# Patient Record
Sex: Female | Born: 1965 | Race: White | Hispanic: No | Marital: Married
Health system: Midwestern US, Academic
[De-identification: ages and names within clinical notes are randomized; demographics above are authoritative.]

---

## 2014-06-15 IMAGING — CR ABDOMEN
1 series · 1 of 1 positions shown · non-contrast
Comparison: none

EXAM:  KUB
HISTORY: Pain.

[abdomen supine kub]
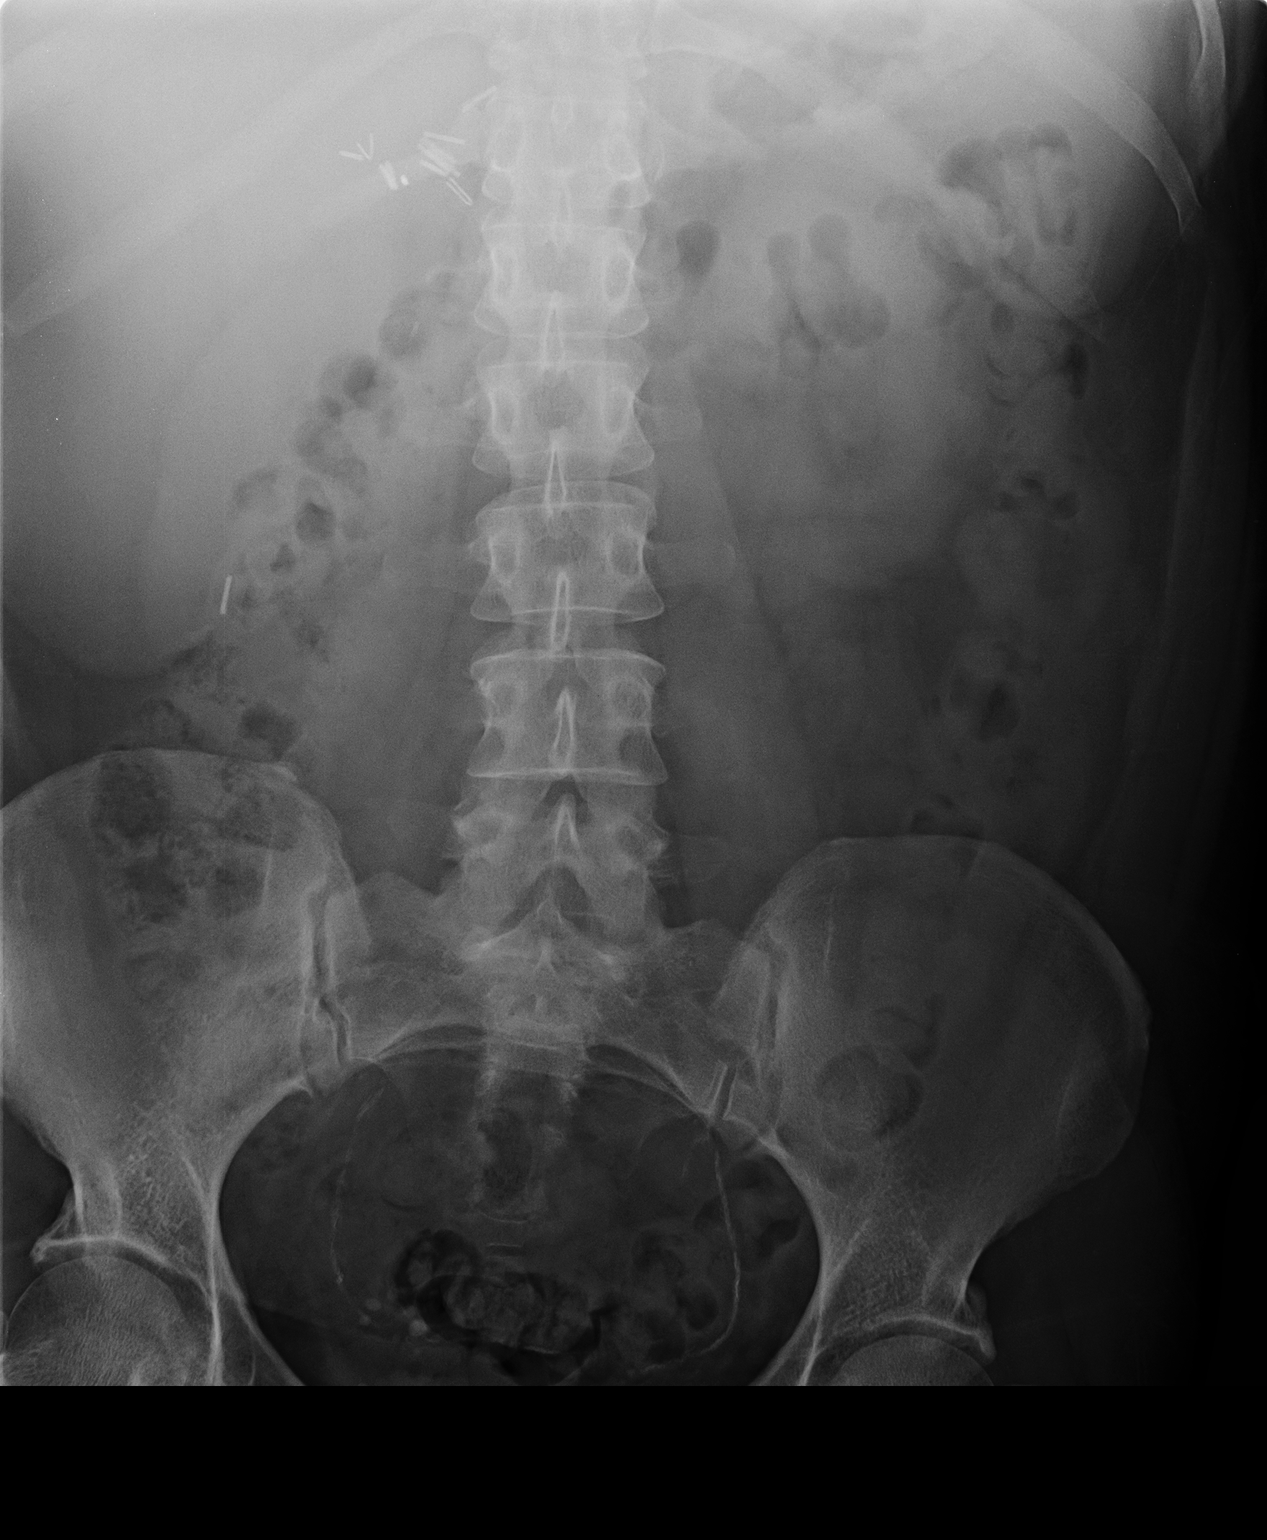

[1 of 1 positions shown; findings below may reference images not displayed]

IMPRESSION: 1. Segmental constipation.
FINDINGS: Segmental constipation but the bowel does not appear to be obstructed.  Clips
consistent with likely prior cholecystectomy.

Tech Notes: LOWER PELVIC PAIN. STRAUCH/TAVIS

## 2014-06-16 IMAGING — CT Abdomen^1_ABDOMEN_PELVIS_WITHOUT (Adult)
1 series · 1 of 1 positions shown, 3 images · non-contrast
Comparison: none

EXAM:  CT ABDOMEN AND PELVIS, NONCONTRAST
HISTORY: Right upper quadrant and right flank pain.

[Series 2: abd/pelvis w/o 5.0 soft tissue · axial · non-contrast · 0.88mm/px · z∈[-139,-139]mm · 1 of 1 slices shown, 3 images]
[im 1/1  soft-tissue]
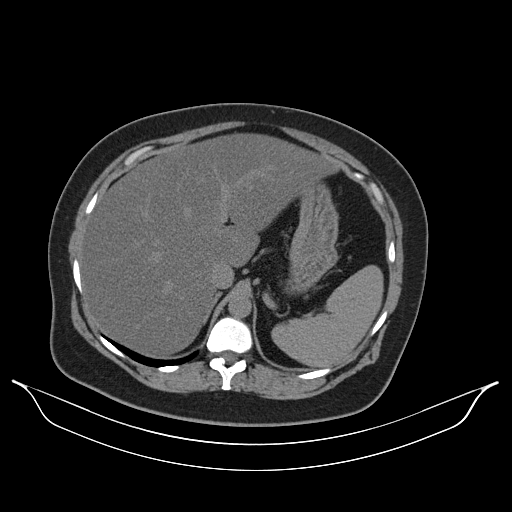
[im 1/1  lung]
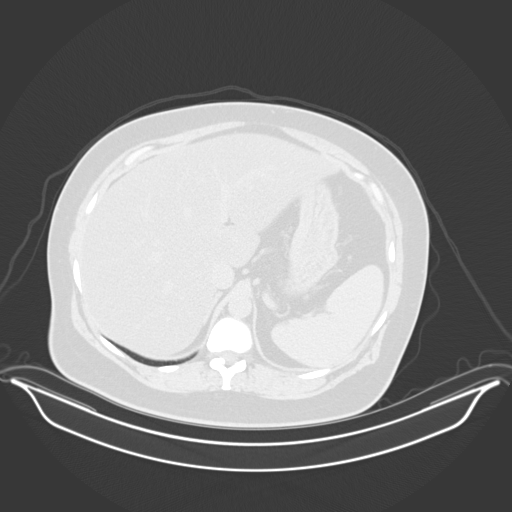
[im 1/1  bone]
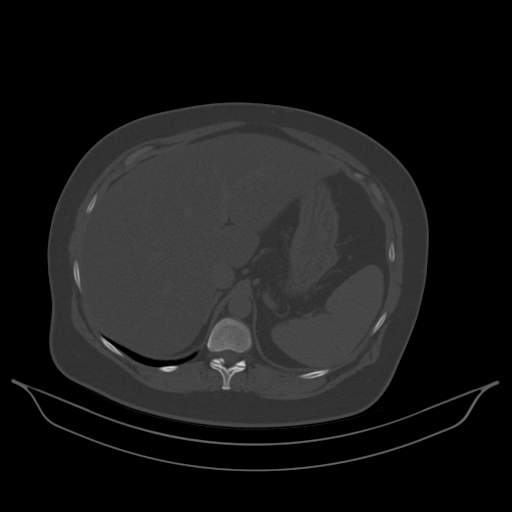

[1 of 1 positions shown; findings below may reference images not displayed]

IMPRESSION: 1.  Hepatic steatosis.
2.  Segmental constipation but not obstruction.
3.  No evident inflammatory process abdomen or pelvis.
Findings to [facility]go, [facility] provider.
FINDINGS: Abdomen:
Uniform low attenuation throughout the liver consistent with hepatic
steatosis.  No focal lesions evident within the liver.  Spleen normal.
Stomach normal.  Pancreas appears normal.
Prior cholecystectomy.
No evident intra or extrahepatic duct dilation.
Adrenal glands and kidneys appear normal.  No findings for obstructive or
inflammatory uropathy or nephrolithiasis.

Pelvis:
No evident diverticulitis or other inflammatory process.  I believe a prior
appendectomy.
Segmental constipation but not obstruction.
No free fluid in the pelvis.
No vesicoliths.

Tech Notes: PT C/O RIGHT SIDE ABD PAIN. HX OF PANCREATITIS AND CHOLE. METELLUS/FARZANA/ME

## 2014-09-26 IMAGING — CR CHEST
1 series · 1 of 1 positions shown · non-contrast
Comparison: none

PROCEDURE: CHEST
HISTORY: ACUTE DIZZINESS, NEAR-SYNCOPE

[chest port x-wise]
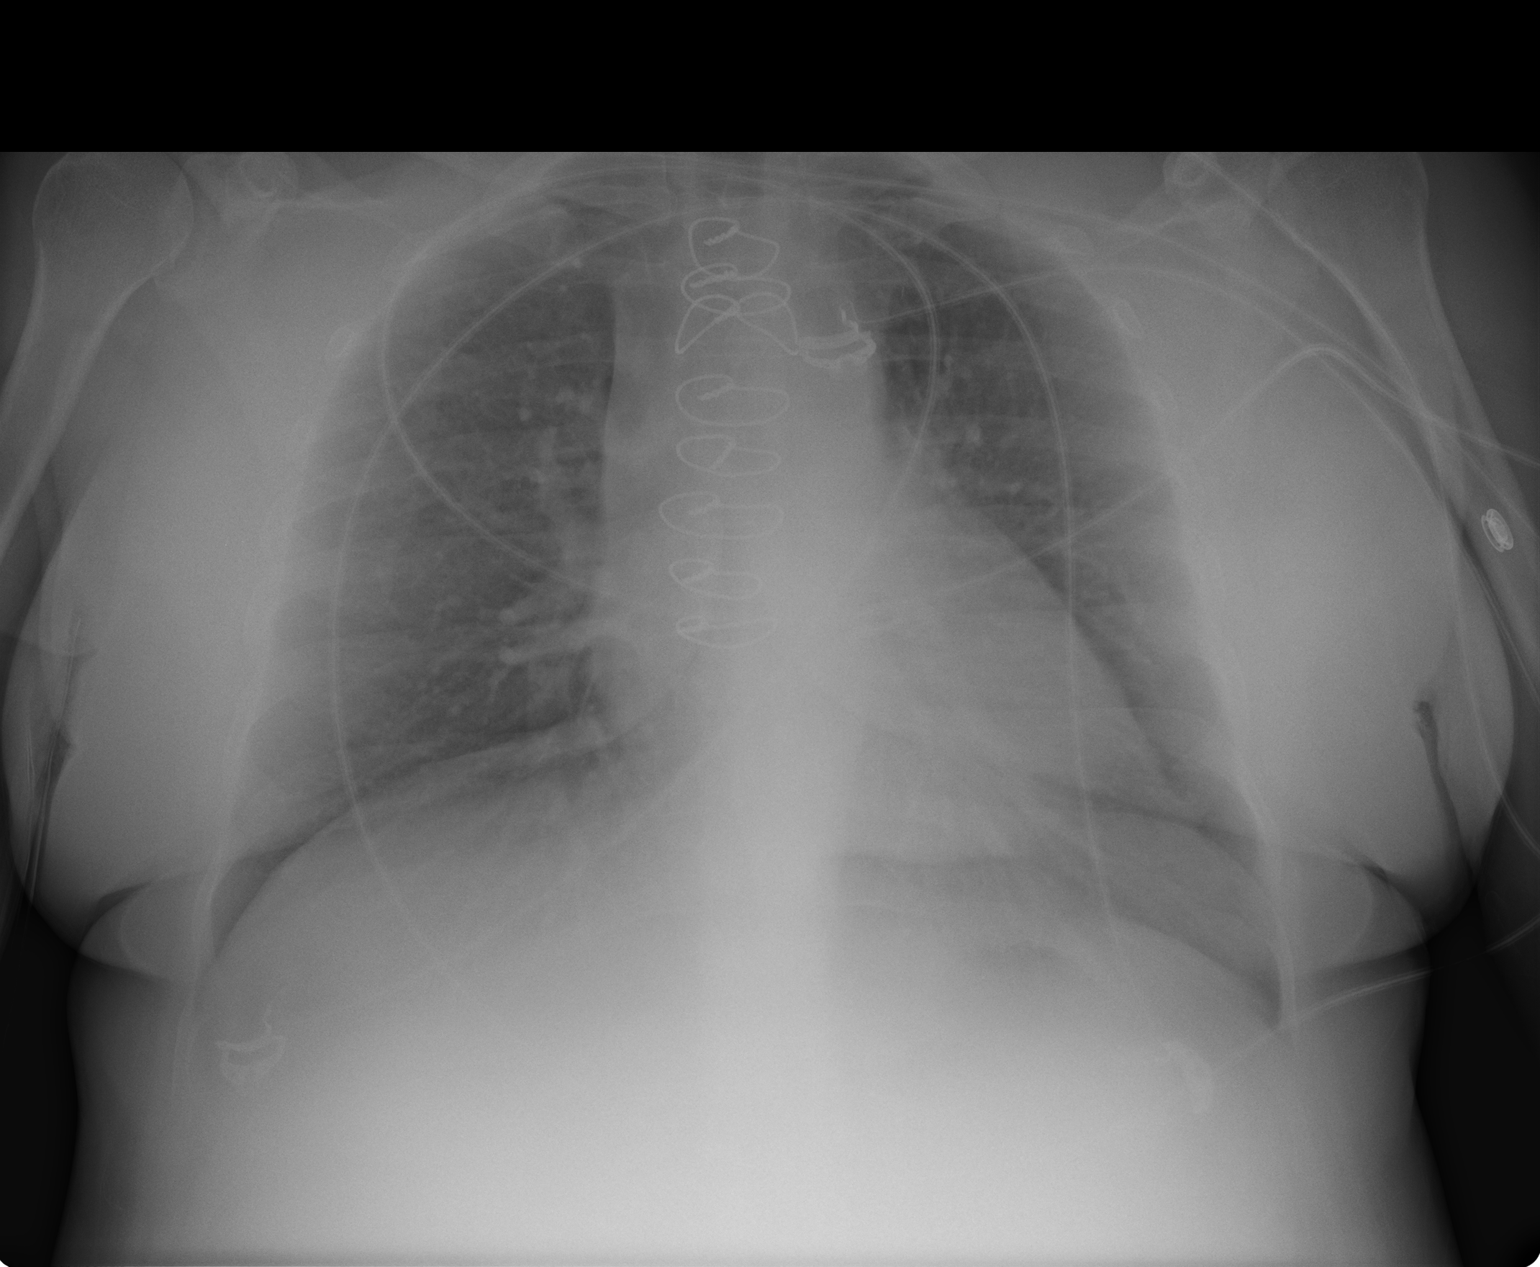

[1 of 1 positions shown; findings below may reference images not displayed]

FINDINGS: Single view chest without comparison shows the cardiac silhouette and mediastinal
structures are within normal limits. There are bilateral low lung volumes seen with vascular
crowding
and bibasilar atelectasis seen. No focal lung consolidation is seen. There is no pleural effusion
or
pneumothorax seen. Visualized osseous structures show no acute findings. Surgical changes are
seen of the mediastinum with sternotomy wires.
IMPRESSION: Low lung volumes are seen without radiographically evident acute cardiopulmonary process.

Tech Notes: ACUTE DIZZINESS, NEAR SYNCOPE -AK

## 2014-10-08 IMAGING — CR CHEST
2 series · 2 of 2 positions shown · non-contrast
Comparison: 26 September, 2014

EXAM: CHEST
REASON FOR EXAM: Vertebral
TECHNIQUE: PA   Lateral

[chest port x-wise]
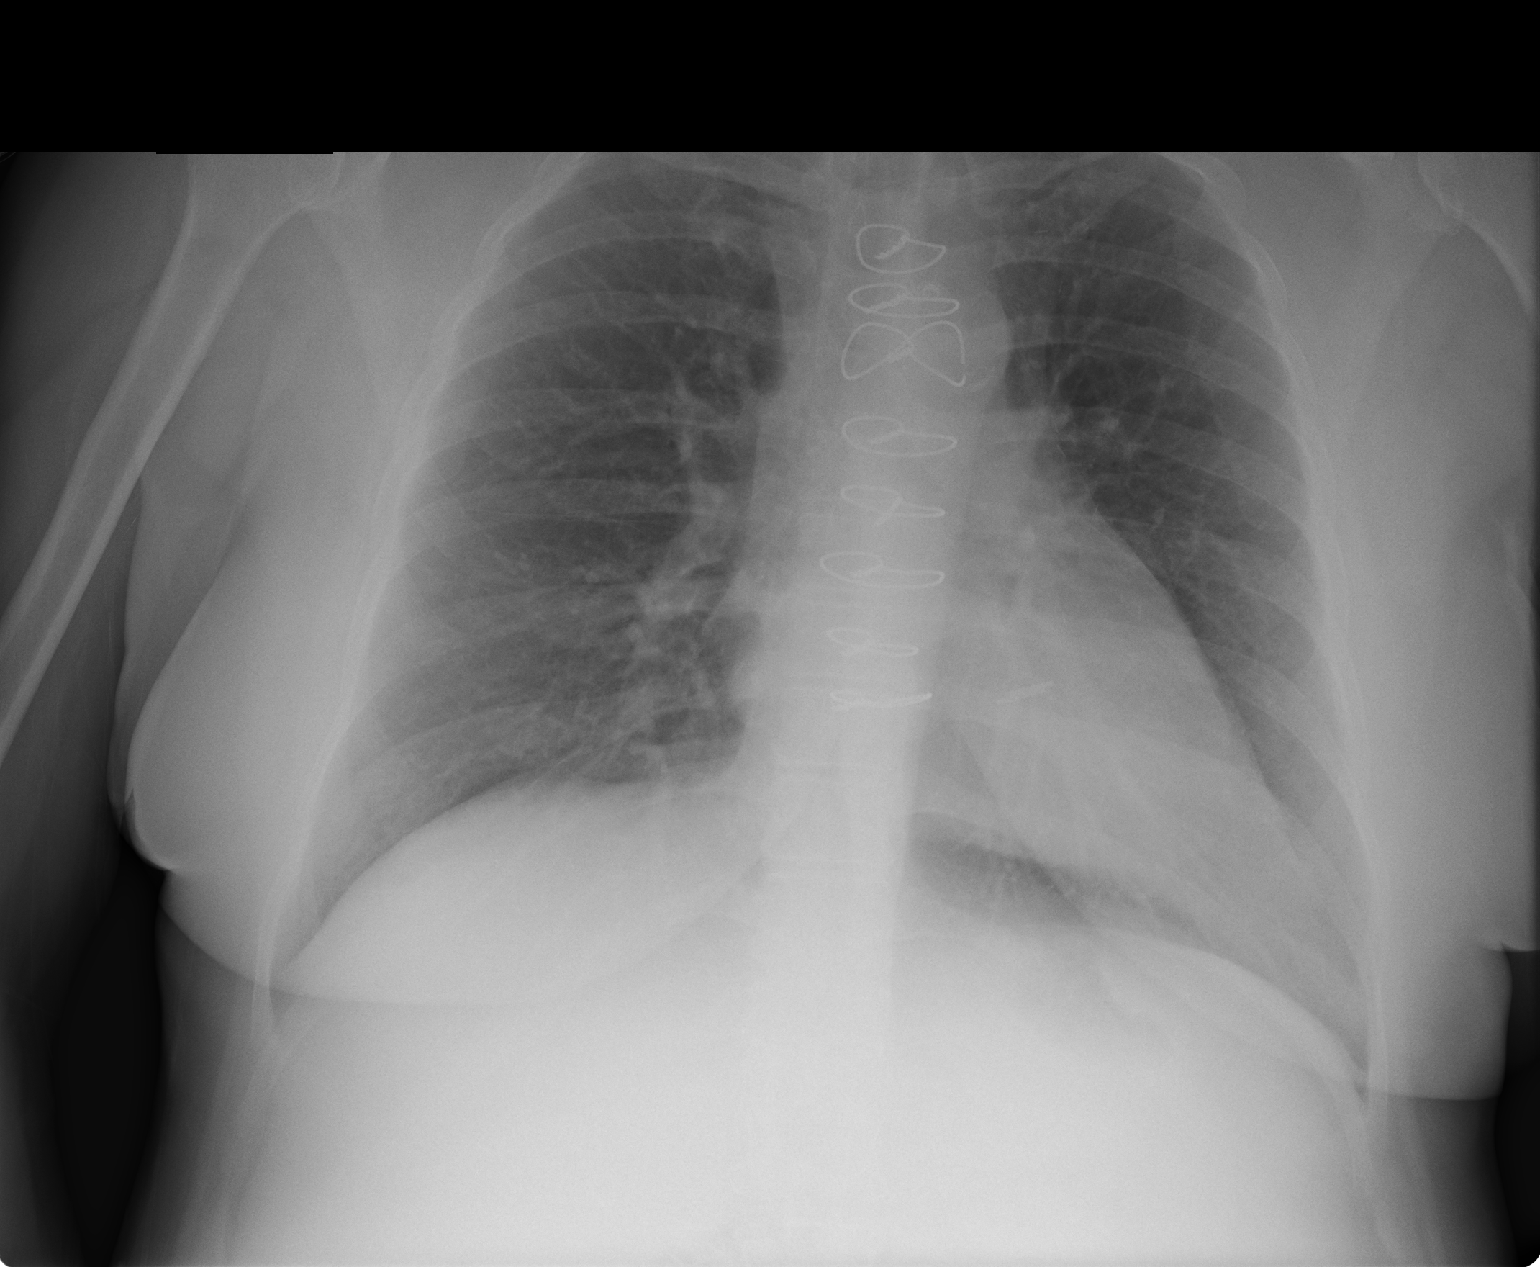

[chest lat]
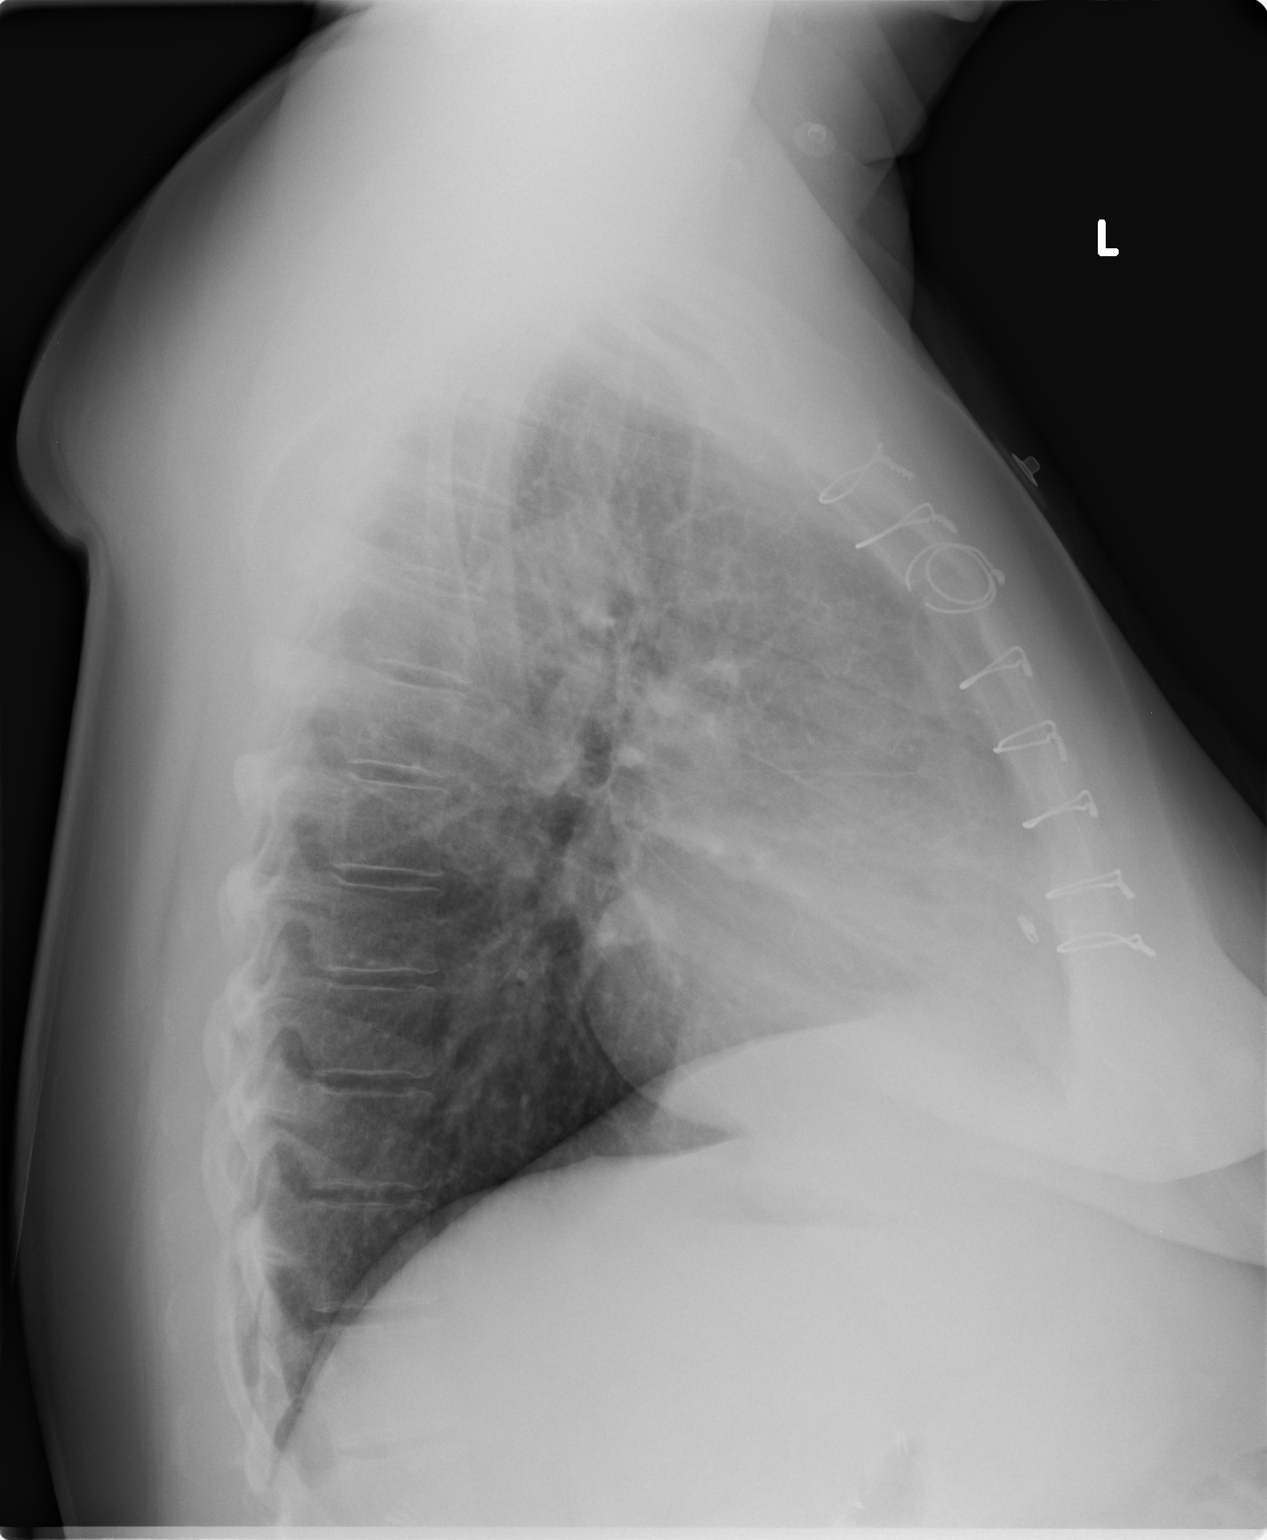

[2 of 2 positions shown; findings below may reference images not displayed]

IMPRESSION: No acute cardiopulmonary process.
Prior sternotomy.
FINDINGS: Lungs are well aerated and clear.  No focal consolidation, pleural effusion
or pneumothorax.  Cardiomediastinal silhouette is unremarkable and stable
when compared to prior study.  There has been prior sternotomy.

Dictated by Adeoye, Cathi
Preliminary report until reviewed and verified by Fitoz, T.C. Mehmet

Tech Notes: VERTIGO X 1 DAY, HX: HAD PREVIOUS EPISODE 2 WEEKS AGO, HTN, CAD, CLOG IN CAROTID ARTERY
JF

## 2014-10-08 IMAGING — CT Head^_WITHOUT_CONTRAST (Adult)
1 of 2 series · 15 of 30 positions shown, 19 images · non-contrast
Comparison: none

PROCEDURE: CT HEAD WITHOUT
HISTORY: ACUTE VERTIGO X 1 DAY, HX: HAD A PREVIOUS SIMILAR EPISODE 2 WEEKS
AGO, H/O CAD, HTN, PT STATES SHE HAS A CLOGGED CAROTID ARTERY

No prior comparison.
TECHNIQUE: Multiple contiguous axial images of the head without contrast.

[Series 2: brain w/o 4.8 brain · axial · non-contrast · 0.46mm/px · z∈[+65,+190]mm · 15 of 28 slices shown, 19 images]
[im 2/28  brain]
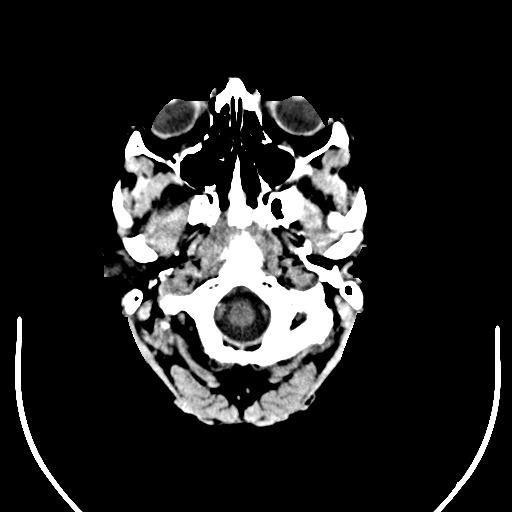
[im 2/28  bone]
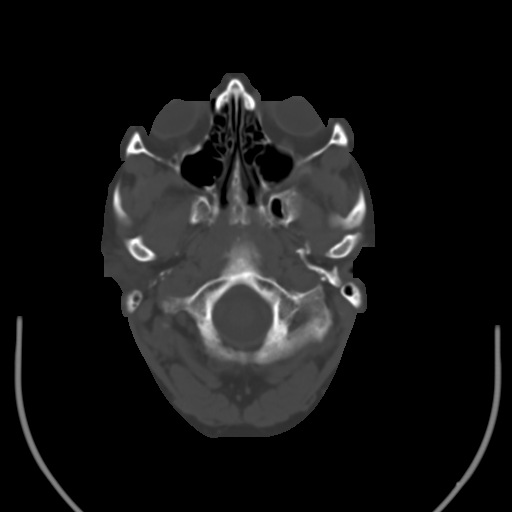
[im 4/28  brain]
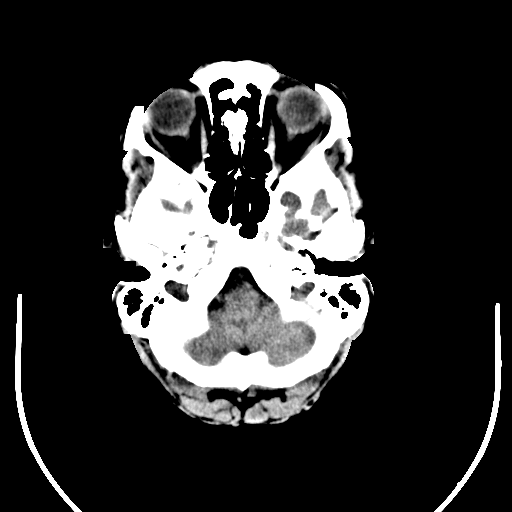
[im 5/28  brain]
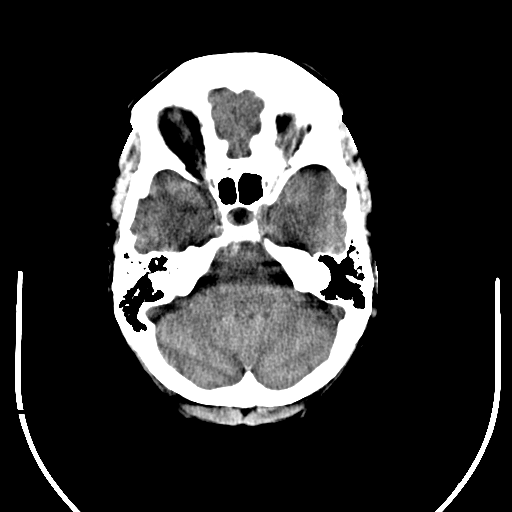
[im 7/28  brain]
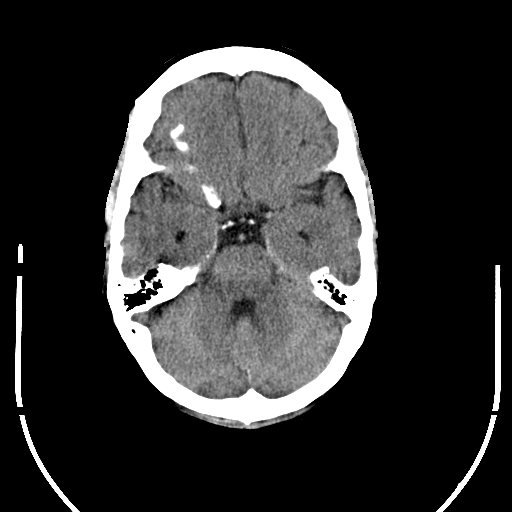
[im 9/28  brain]
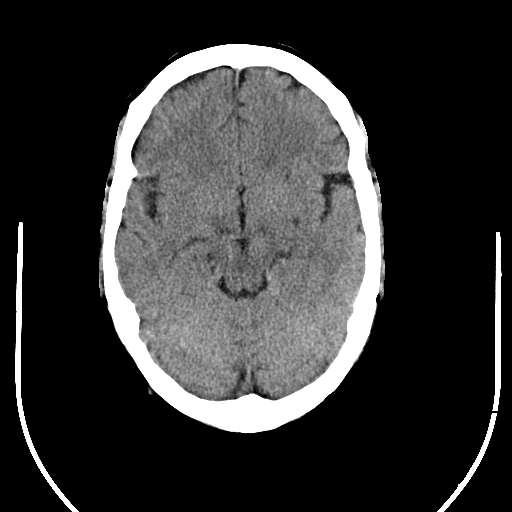
[im 9/28  bone]
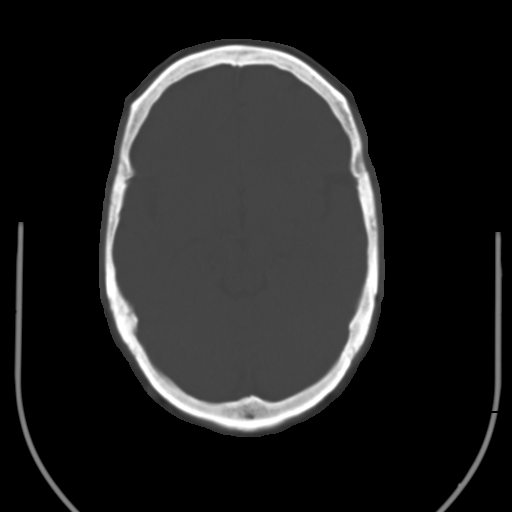
[im 10/28  brain]
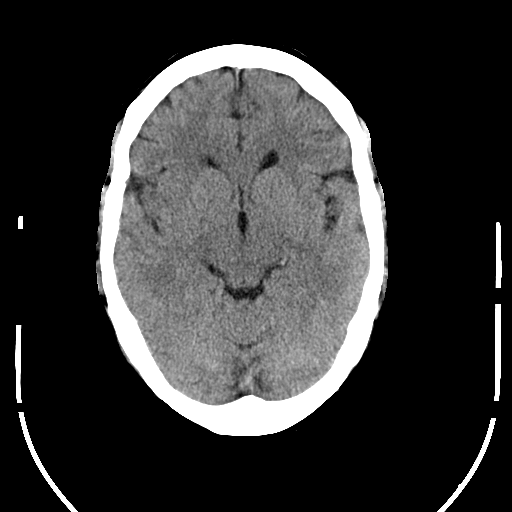
[im 13/28  brain]
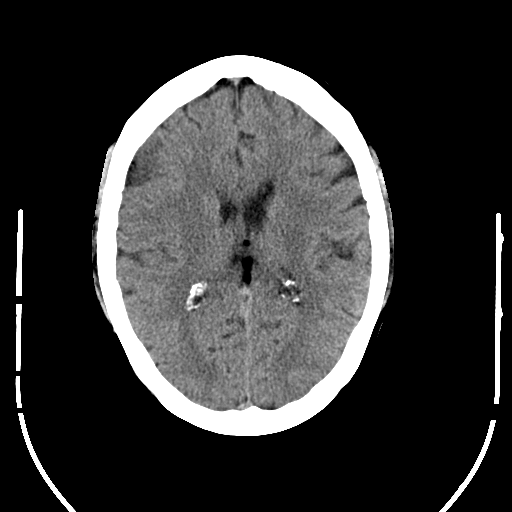
[im 14/28  brain]
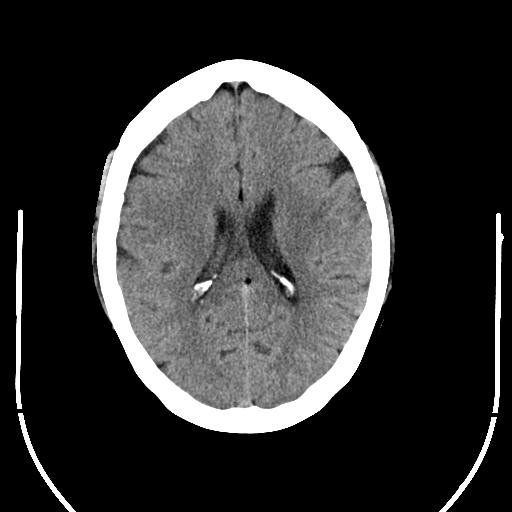
[im 15/28  brain]
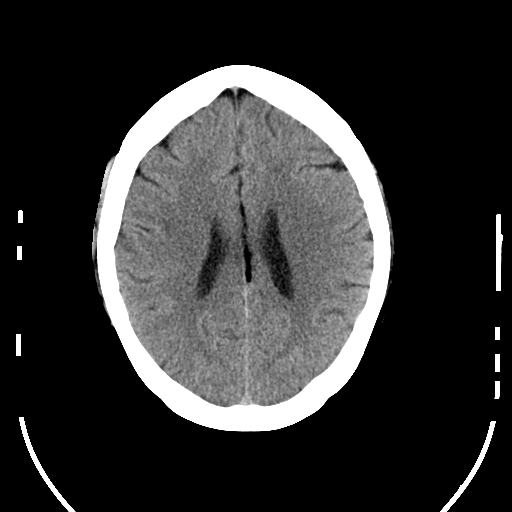
[im 15/28  bone]
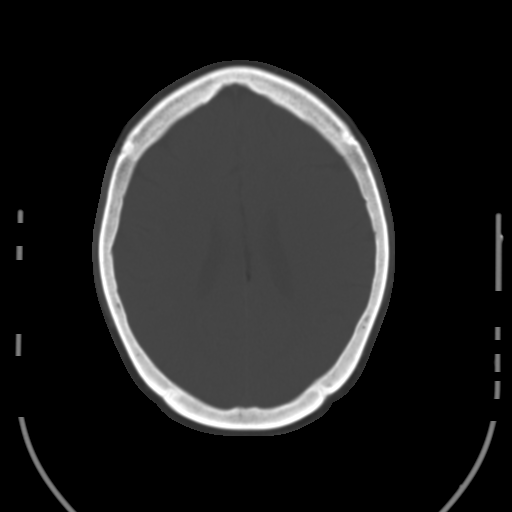
[im 18/28  brain]
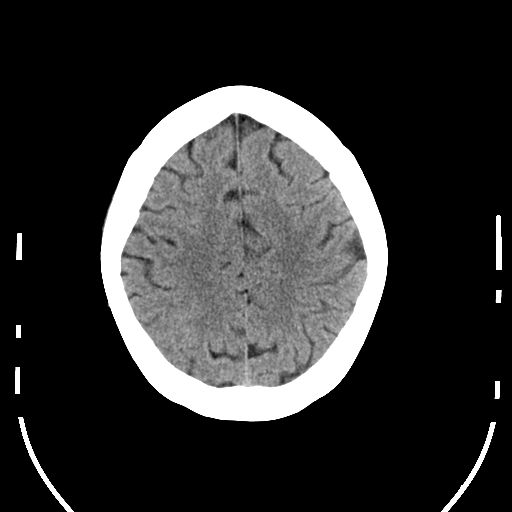
[im 19/28  brain]
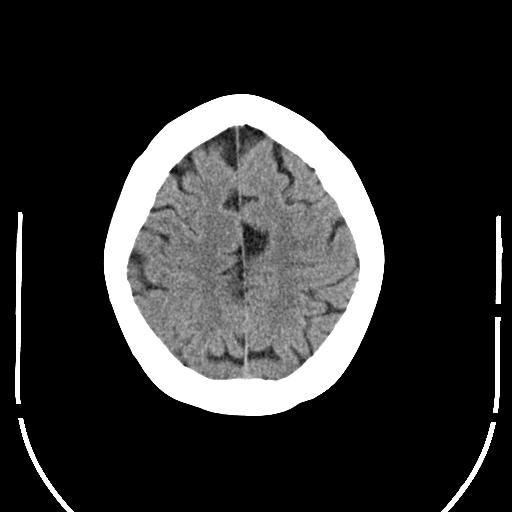
[im 21/28  brain]
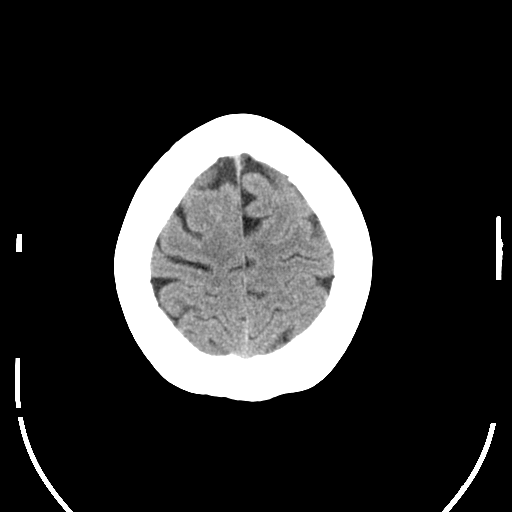
[im 23/28  brain]
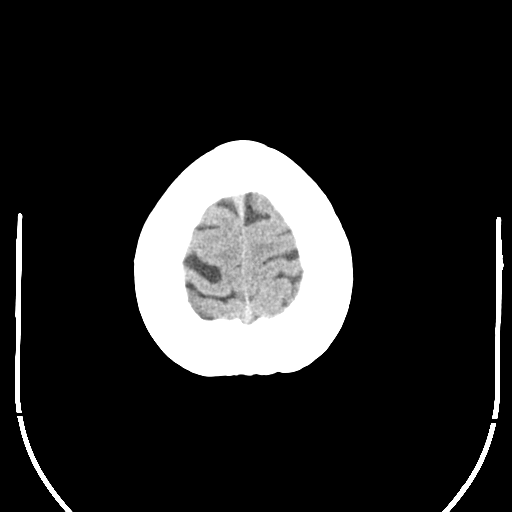
[im 23/28  bone]
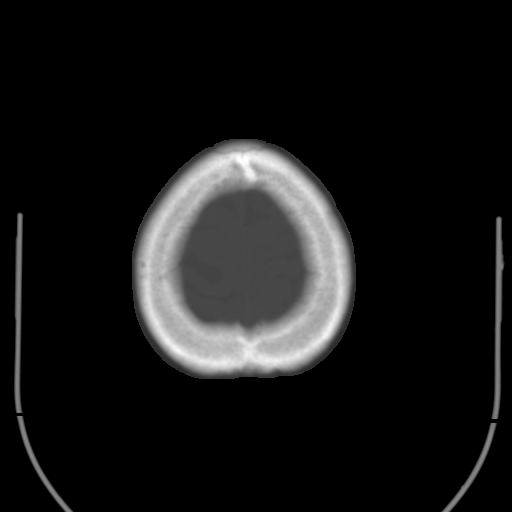
[im 24/28  brain]
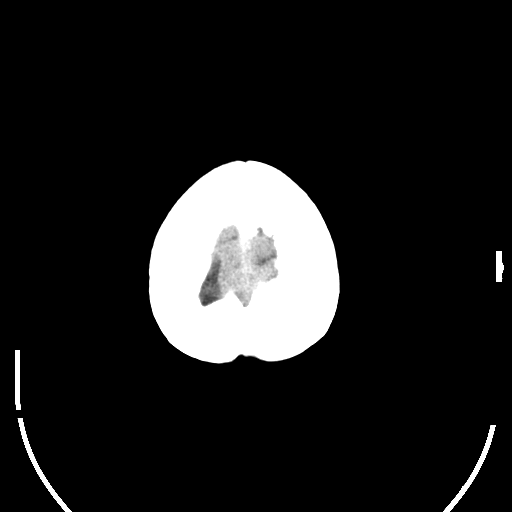
[im 26/28  brain]
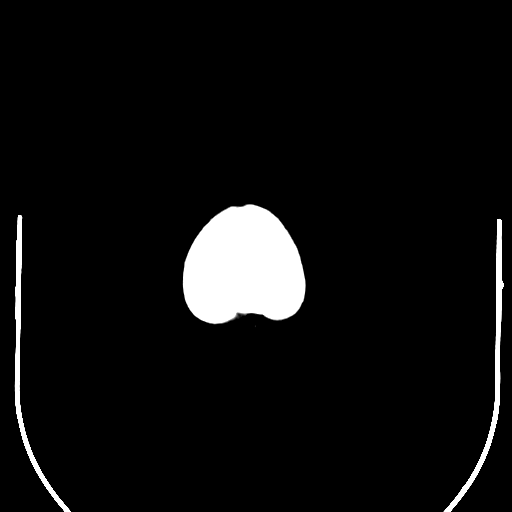

[15 of 30 positions shown; findings below may reference images not displayed]

FINDINGS: There is no acute intracranial hemorrhage. No abnormal extra-axial fluid collection is
seen.
There no findings of cerebral edema. There is no intracranial mass effect or shift of midline
structures.
There is no sulcal effacement. The gray-white differentiation is preserved throughout. The basilar
cisterns are open. No focal encephalomalacia is seen. The calvarium is intact. The visualized
sinuses
and mastoid air cells are clear. The frontal sinuses are hypoplastic.
IMPRESSION: No acute intracranial pathology identified by CT.

INTERPRETING DOCTOR: CUBAS, KENNETH M.D.

Tech Notes: VERTIGO X 1 DAY, HX: HAD PREVIOUS EPISODE 2 WEEKS AGO, HTN, CAD, CLOG IN CAROTID ARTERY
JF
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
## 2017-09-25 IMAGING — CR CHEST
1 series · 1 of 1 positions shown · non-contrast
Comparison: none

EXAM

Chest 1 view.
INDICATION
Shortness of breath.

[chest port x-wise]
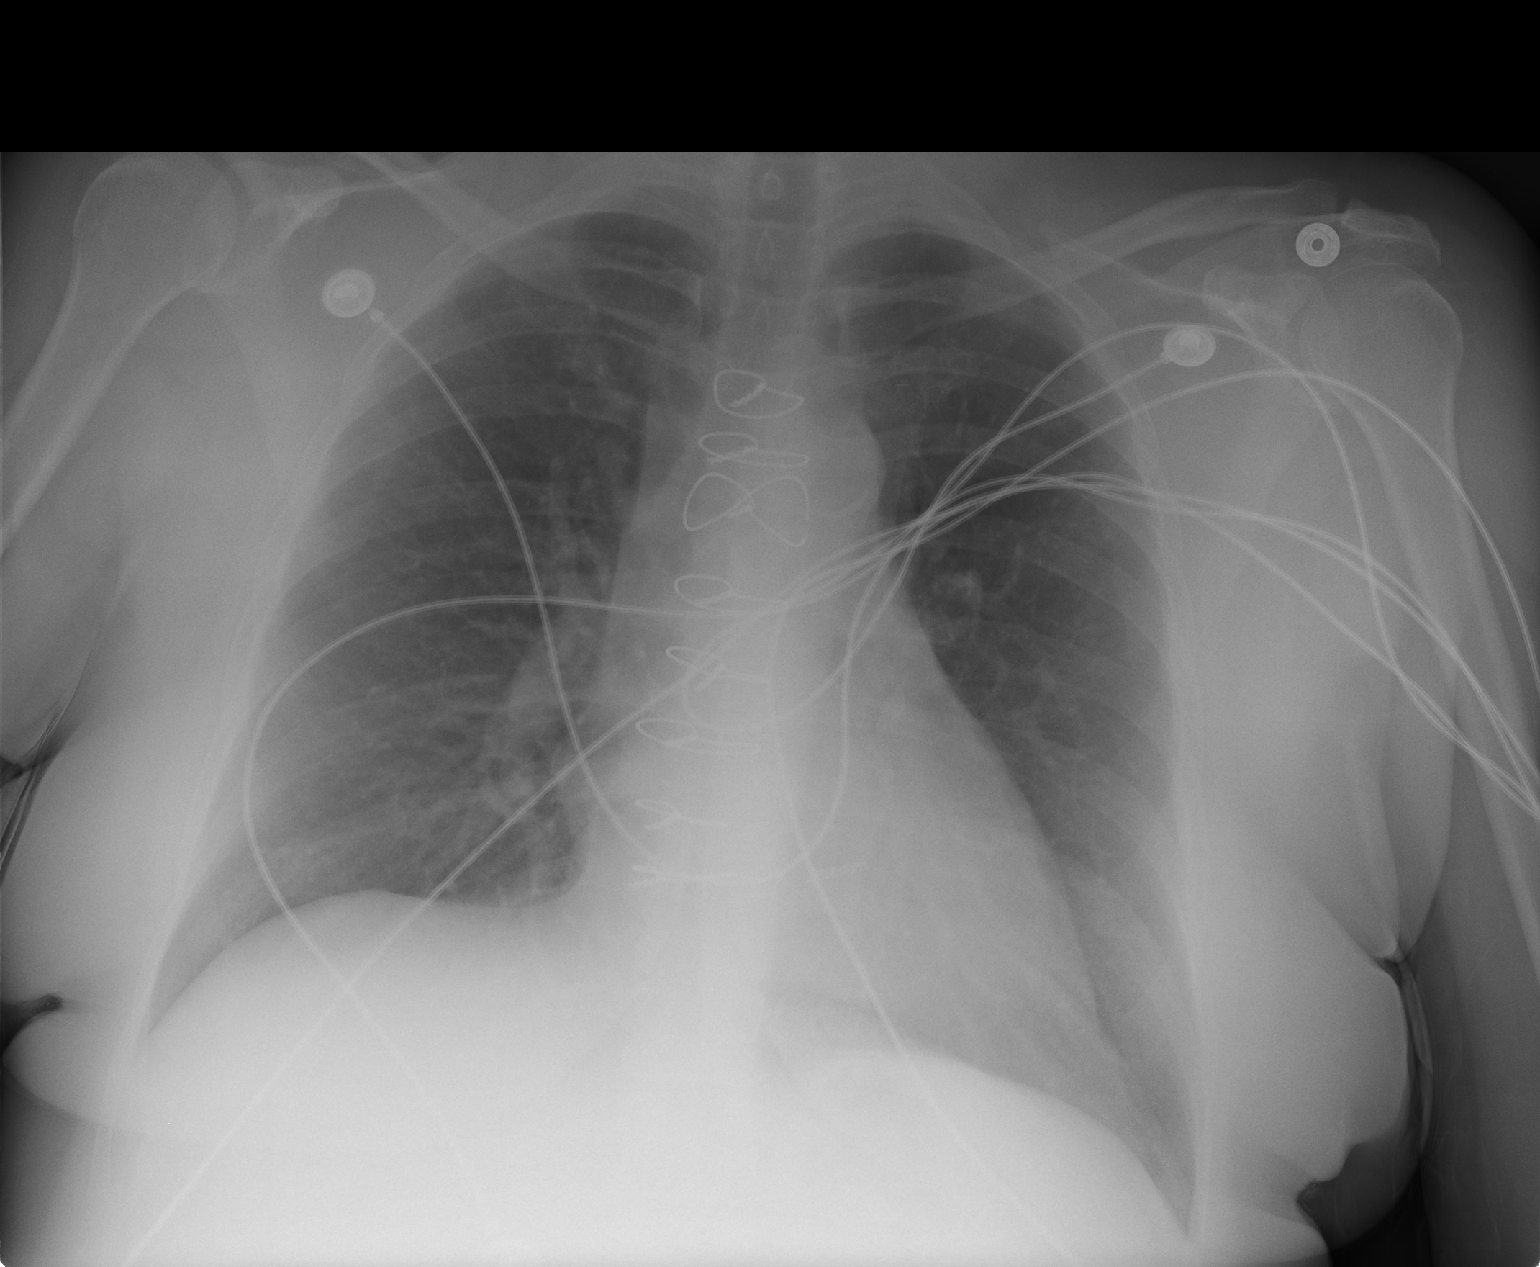

[1 of 1 positions shown; findings below may reference images not displayed]

FINDINGS: There are no comparisons.

The heart is normal in size. There has been a median sternotomy.

There is no failure, effusion, or consolidation.

IMPRESSION

There is no acute lung process.

Tech Notes:

SOA AND HYPERGLYCEMIA X TODAY. H/O CAD WITH BYPASS IN 5144. ME

## 2018-02-15 IMAGING — CT ABDOMEN_PELVIS W(Adult)
2 of 3 series · 13 of 46 positions shown, 15 images · IV contrast (agent unspecified)
Comparison: none

PROCEDURE: ABDOMEN_PELVIS W(Adult)
HISTORY: Pt c/o epigastric pain and increased amylase. Hx of pancreatitis and diabetes. AW
TECHNIQUE: Axial CT imaging of the abdomen and pelvis was performed with IV contrast. This exam was
performed using one or more the following dose reduction techniques: Automated exposure control,
adjustment of the mA and/or KV according to the patient's size or use of iterative reconstruction
technique.
Total DLP dose measures 296 mGy with a total CTDI dose measuring 6 mGy.

[Series 2: abdomen ax 3.00 br40 s3 · axial · 0.57mm/px · z∈[+1197,+1589]mm · 10 of 151 slices shown, 12 images]
[im 10/151  soft-tissue]
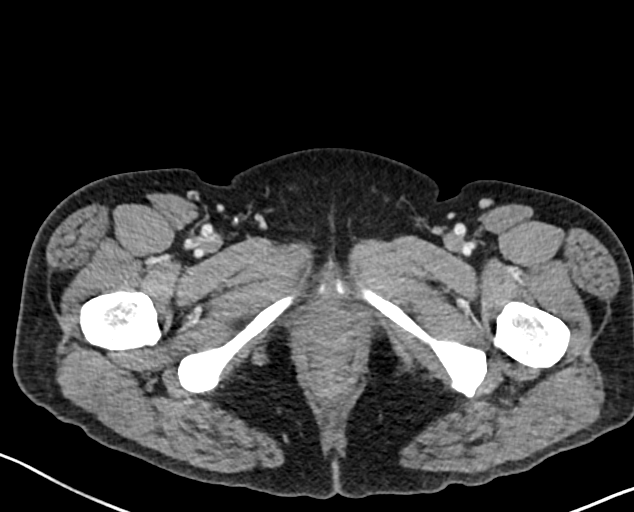
[im 10/151  bone]
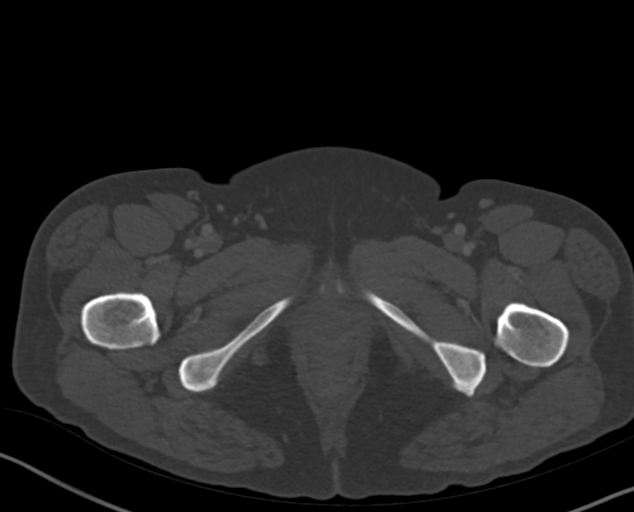
[im 25/151  soft-tissue]
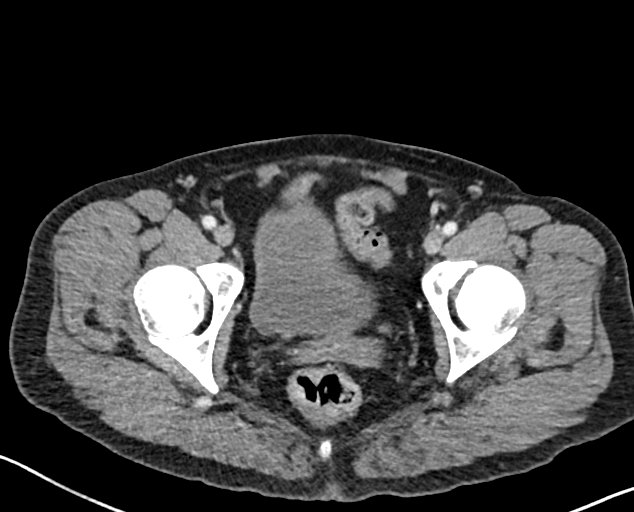
[im 39/151  soft-tissue]
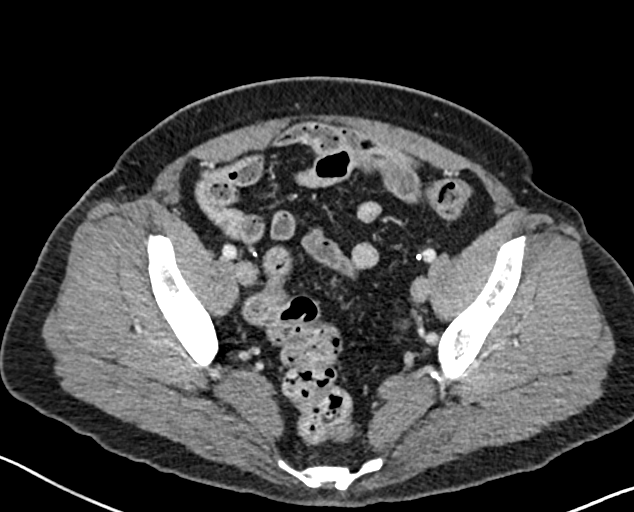
[im 54/151  soft-tissue]
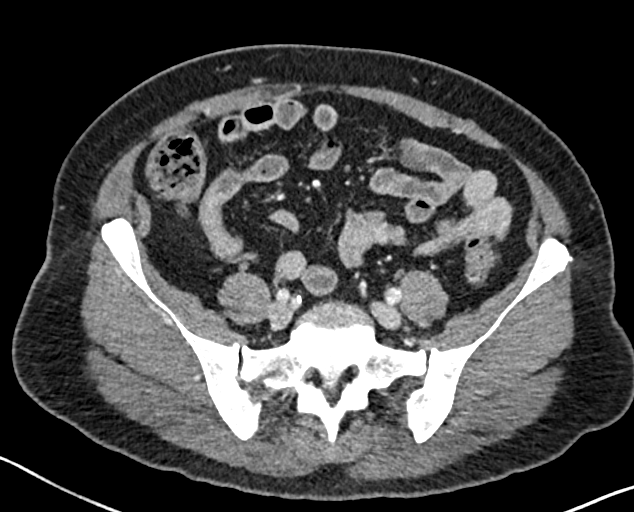
[im 68/151  soft-tissue]
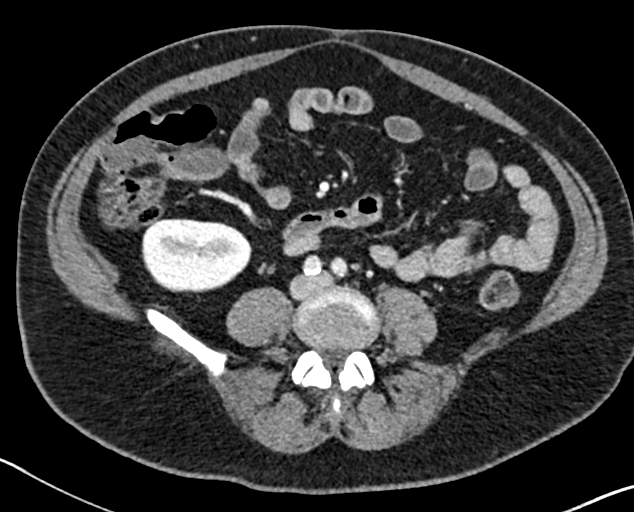
[im 83/151  soft-tissue]
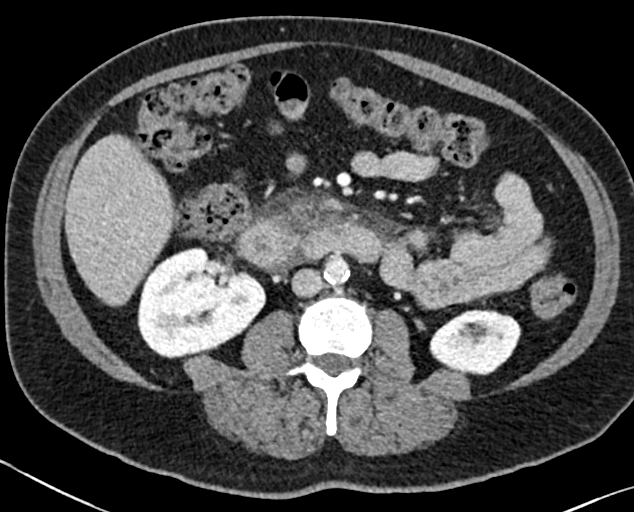
[im 97/151  soft-tissue]
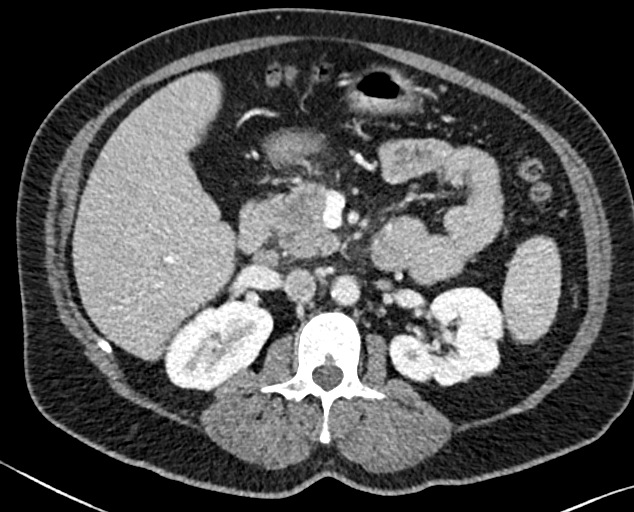
[im 112/151  soft-tissue]
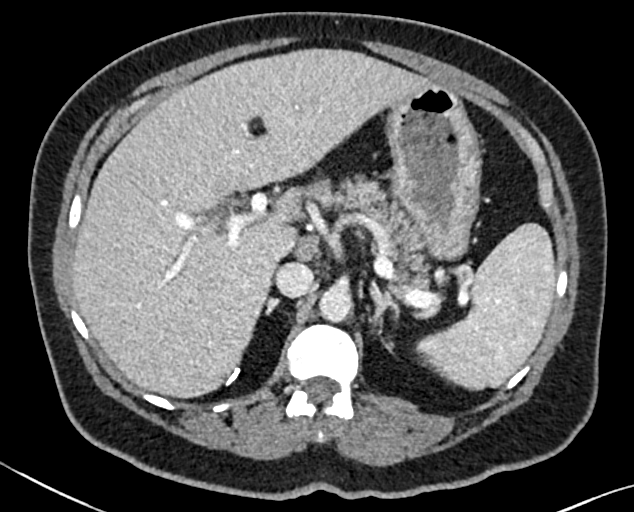
[im 126/151  soft-tissue]
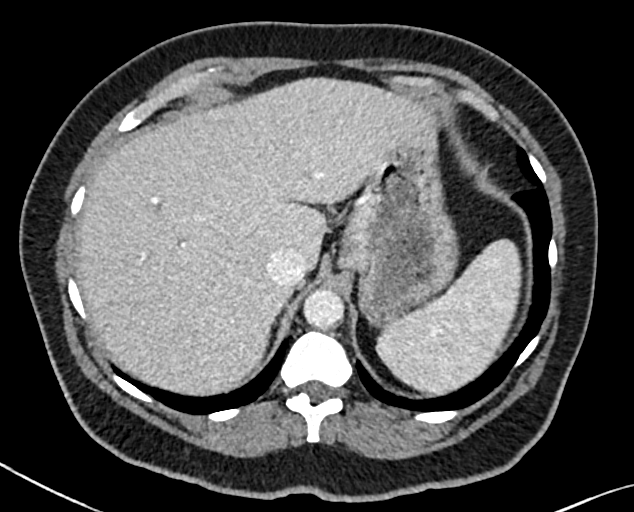
[im 126/151  bone]
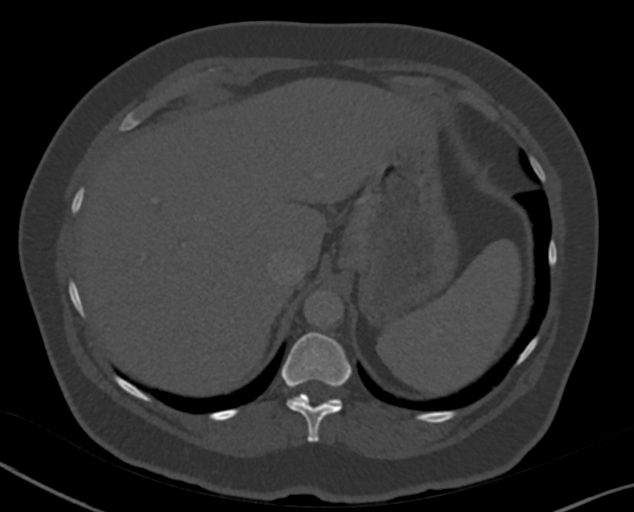
[im 141/151  soft-tissue]
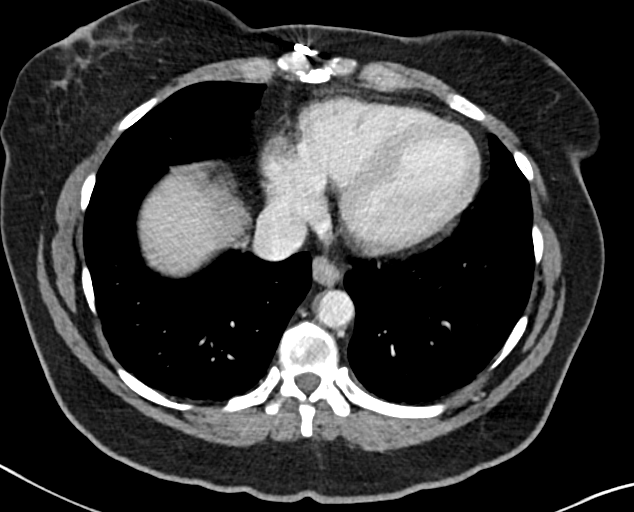

[Series 4: abdomen cor 3.00 br40 s3 · coronal · 0.70mm/px · 3 of 97 slices shown]
[im 33/97  soft-tissue]
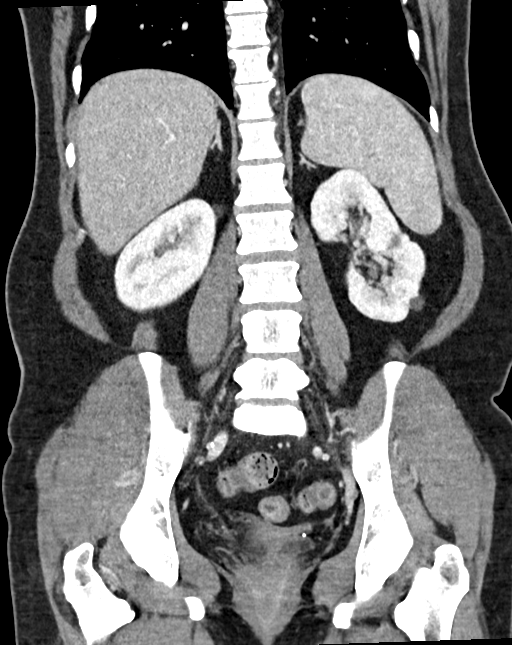
[im 43/97  soft-tissue]
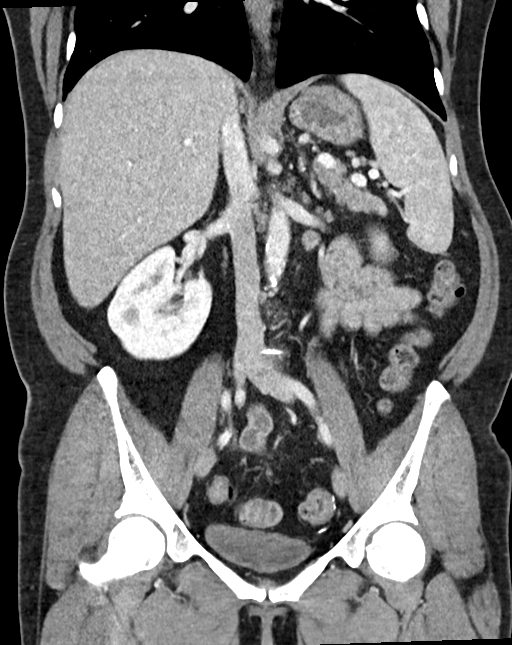
[im 54/97  soft-tissue]
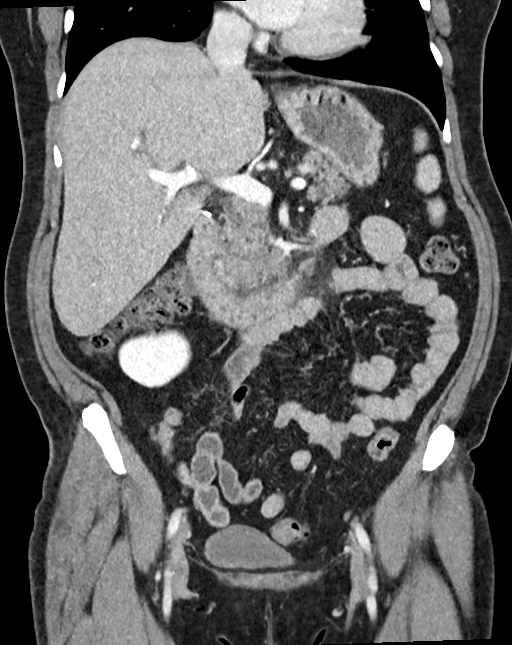

[13 of 46 positions shown; findings below may reference images not displayed]

FINDINGS: The lung bases are clear. There is enlargement with mild stranding of the head of the pancreas
diffusely.
No focal pancreatic lesion or ductal dilation is seen. Surgical changes are seen reflecting
cholecystectomy.
The liver, spleen, and adrenals are within normal limits. There is no intrahepatic ductal
dilatation seen. No
mesenteric or retroperitoneal adenopathy is seen. The right kidney shows no hydronephrosis or
perinephric
fat stranding. The left kidney shows no hydronephrosis or perinephric fat stranding. The aorta is
normal in
course and caliber. No pathologically dilated loops of large or small bowel are seen. Normal
appearing
appendix is not definitively seen in the right lower quadrant. Moderate amount of stool seen
throughout the
colon. There is no free air or free fluid seen in the abdomen. The contrasted urinary bladder is
unremarkable.
IMPRESSION: 1. There is no hydronephrosis or perinephric fat stranding seen.
2. Nonobstructive bowel gas pattern without free air.
3. Mild pancreatitis predominately involving the head of the pancreas with enlargement and fat
stranding.
No abscess or pseudocyst is seen.

Tech Notes:

Pt hx of pancreatitis and diabetes. Advised to withold metformin for 48 hours. GFR 81. 100mL [id number]
administered. AW

## 2018-04-04 IMAGING — NM ONE DAY MPI
4 series · 14 of 14 positions shown · non-contrast
Comparison: none

[(person_name) · 4.52mm/px · 1 of 1 slices shown (1 of 2)]
[im 1/1]
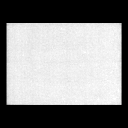

[(person_name) · 4.52mm/px · 1 of 1 slices shown (2 of 2)]
[im 1/1]
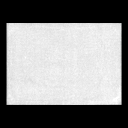

[rest · 6.78mm/px · 6 of 72 frames shown]
[frame 7/72]
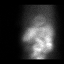
[frame 19/72]
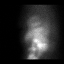
[frame 31/72]
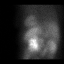
[frame 43/72]
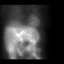
[frame 55/72]
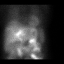
[frame 67/72]
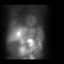

[sgate · 6.78mm/px · 6 of 576 frames shown]
[frame 49/576]
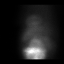
[frame 145/576]
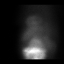
[frame 241/576]
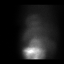
[frame 337/576]
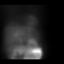
[frame 433/576]
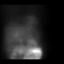
[frame 529/576]
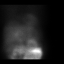

[14 of 14 positions shown; findings below may reference images not displayed]

FINAL REPORT IS SCANNED IN THE PATIENT'S EMR.

Tech Notes:

## 2018-07-31 IMAGING — CR LOW_EXM
3 series · 3 of 3 positions shown · non-contrast
Comparison: none

[foot]
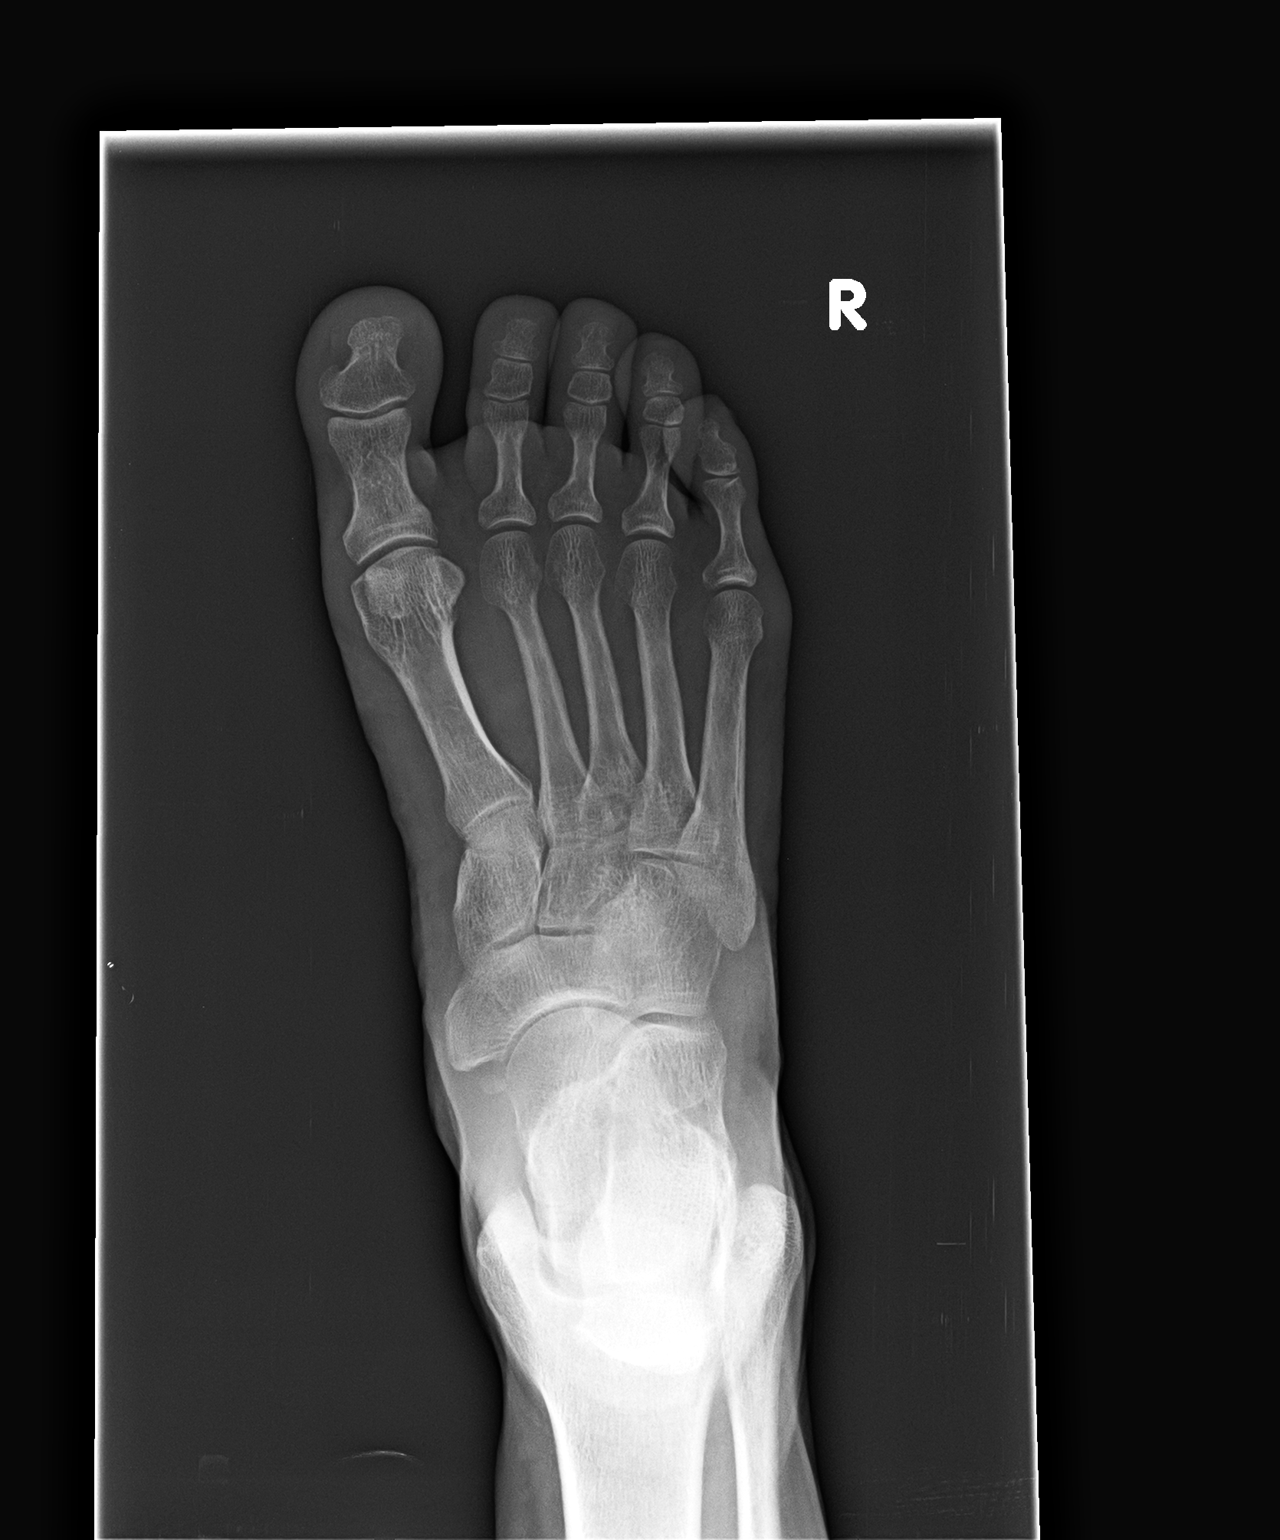

[foot obl]
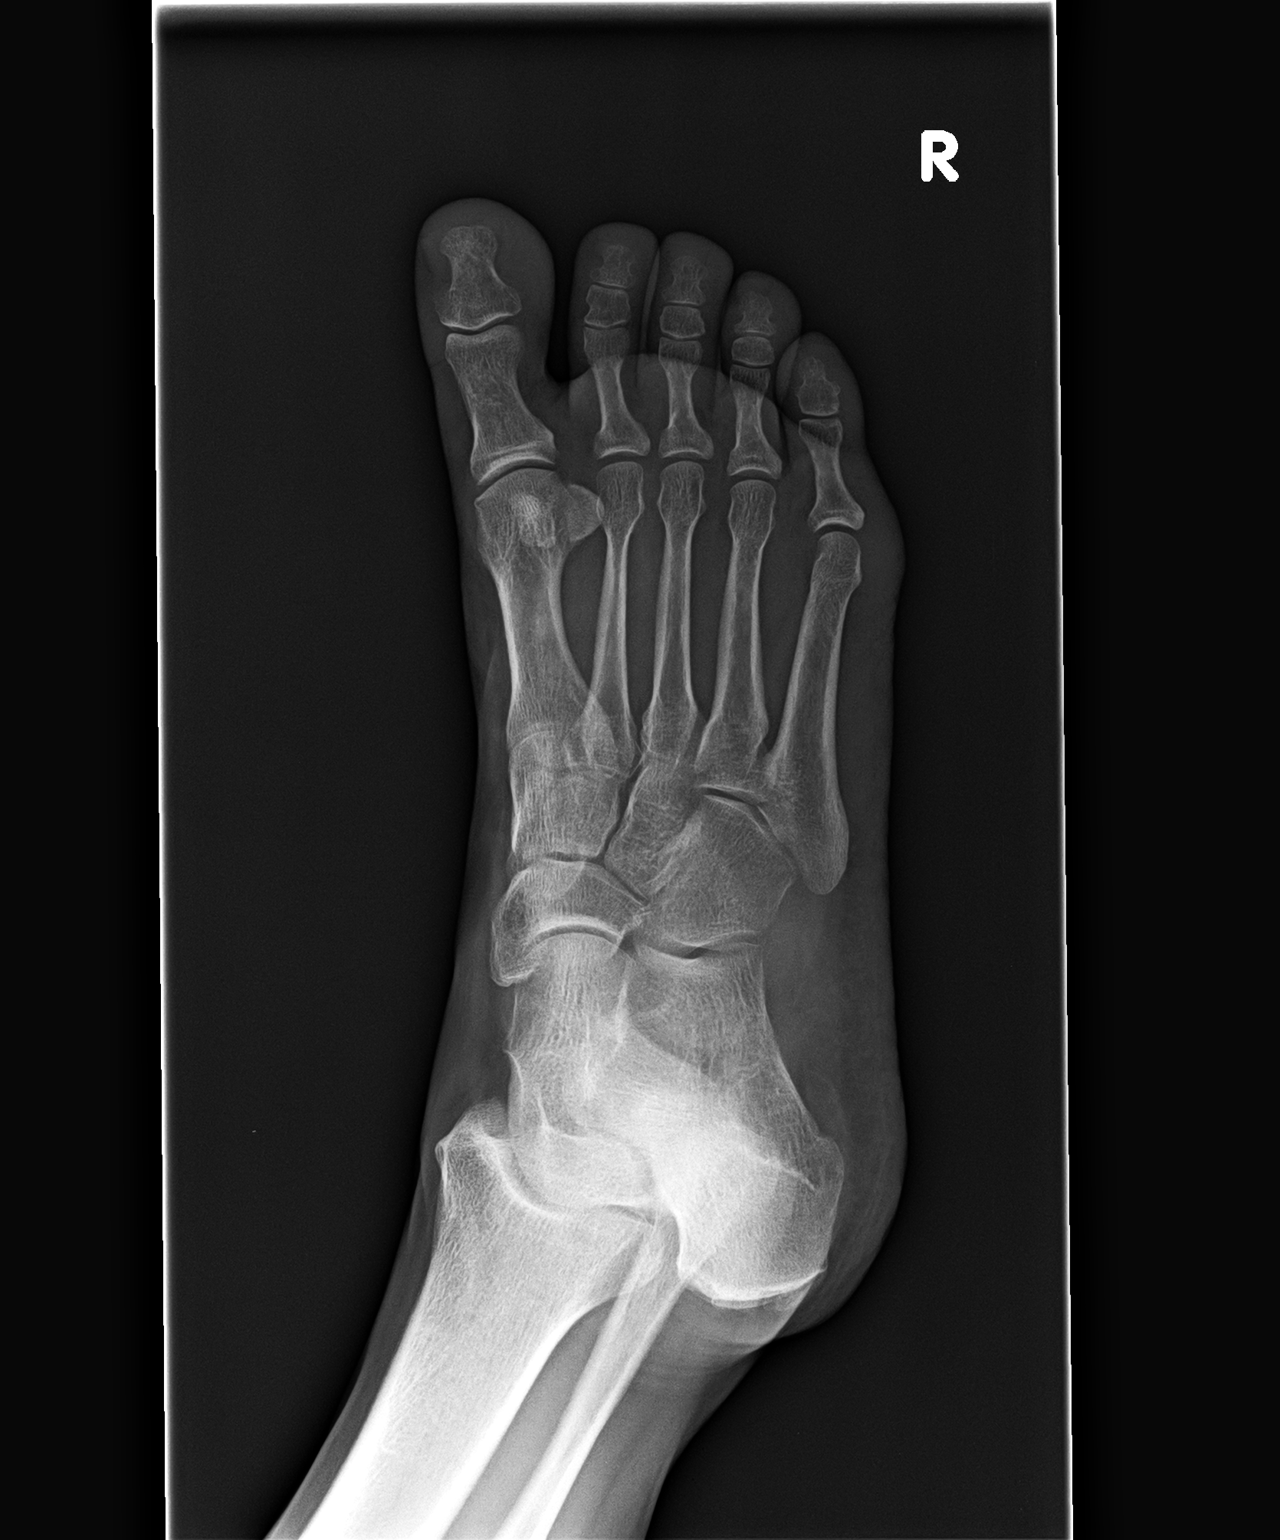

[foot lat]
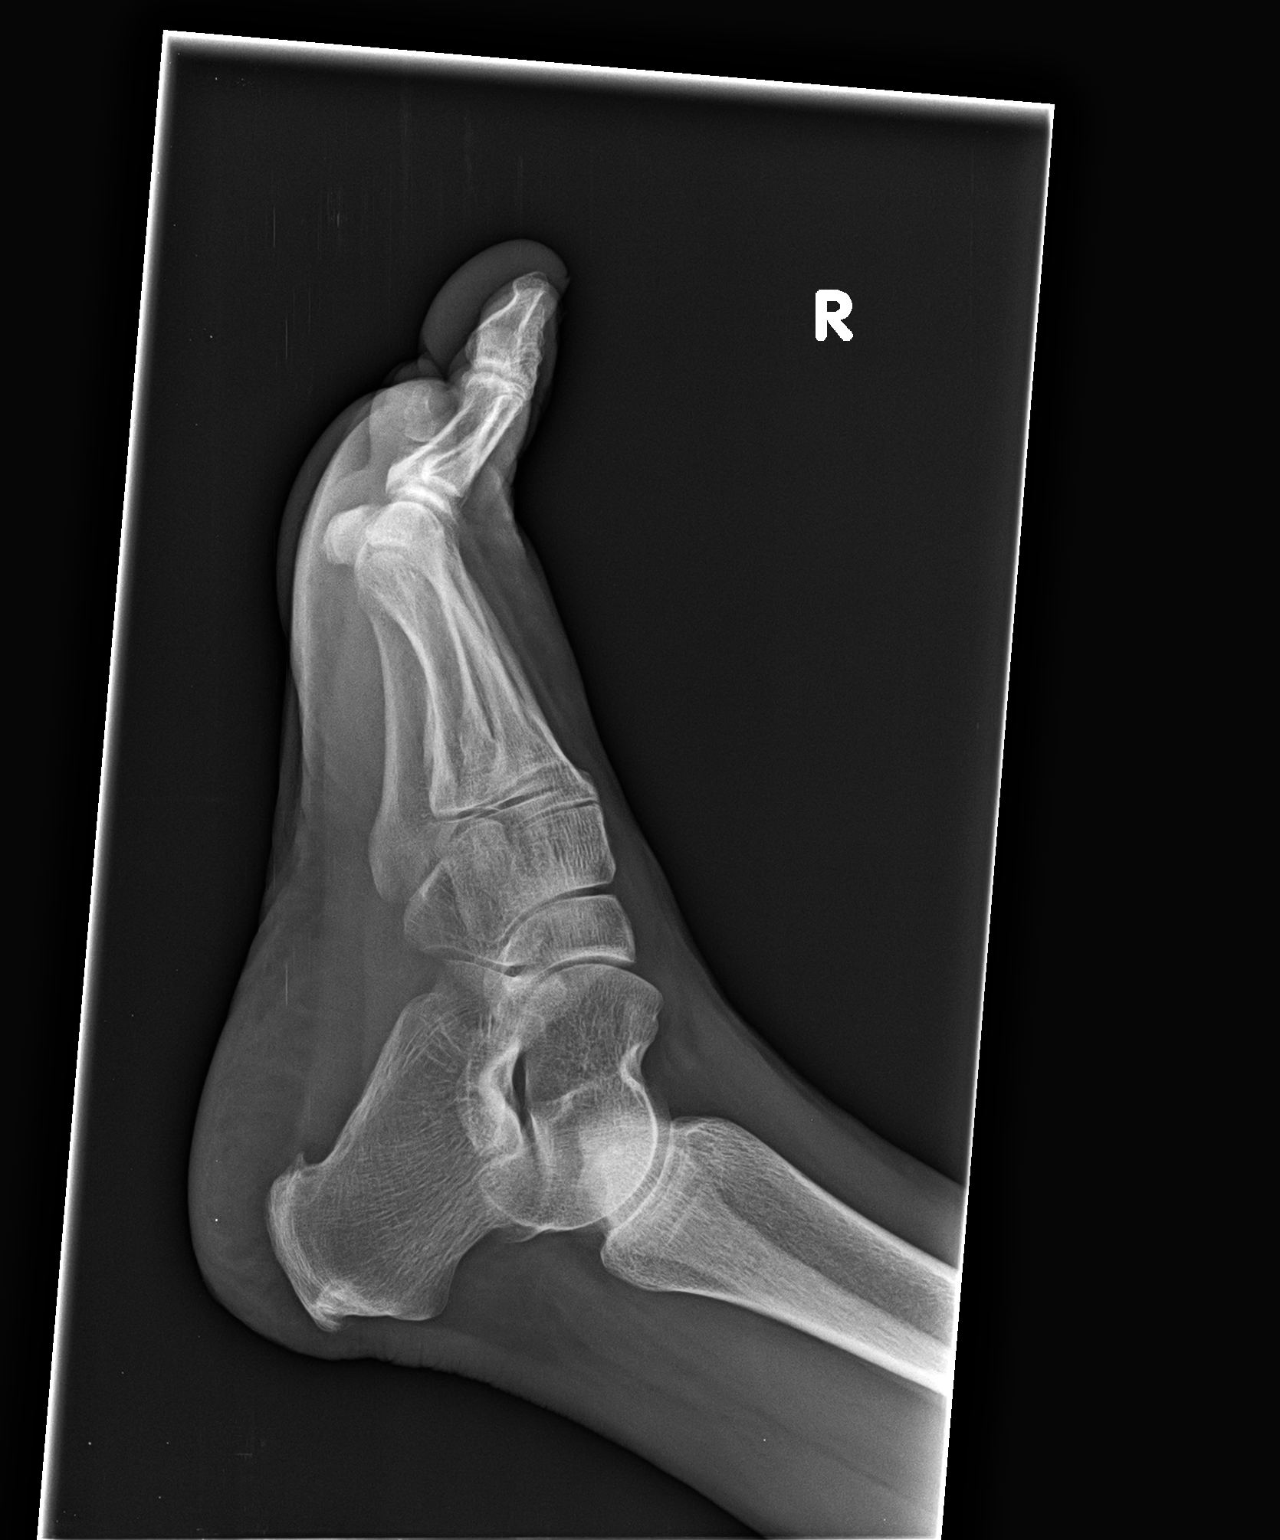

[3 of 3 positions shown; findings below may reference images not displayed]

EXAM

3 VIEW RIGHT FOOT

INDICATION

Right foot pain.

TECHNIQUE

Frontal, oblique, and lateral radiographs of the right foot were submitted.

COMPARISONS

None

FINDINGS

There is no acute fracture or malalignment. The joint spaces are preserved. No aggressive bone
lesion is identified. The Lisfranc interval is normal. There are small plantar and posterior
calcaneal enthesophytes.

IMPRESSION

No acute bony abnormality.

Tech Notes:

RIGHT FIRST DIGIT PAIN

## 2019-04-29 IMAGING — CR CHEST
1 series · 1 of 1 positions shown · non-contrast
Comparison: none

[chest pa x-wise]
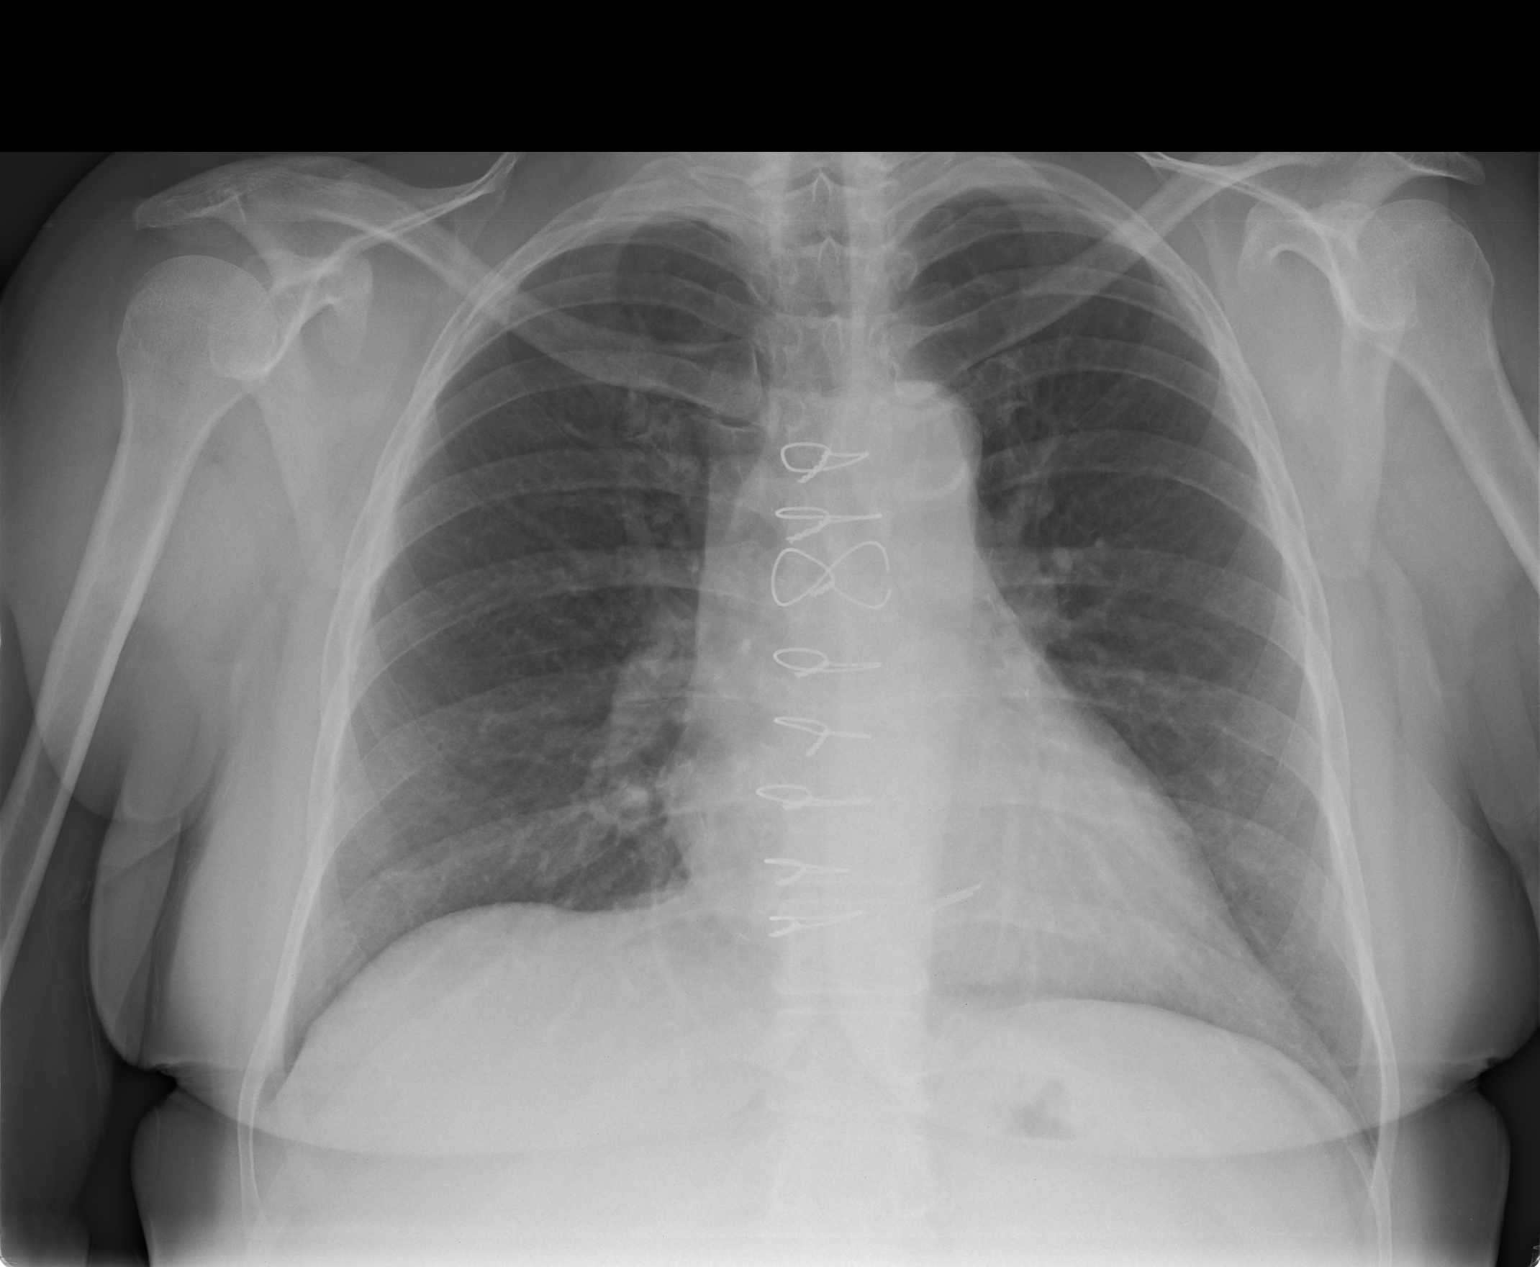

[1 of 1 positions shown; findings below may reference images not displayed]

EXAM

Chest x-ray

INDICATION

cough, SOA, COVID + day 8
COVID POSITIVE.  AB

FINDINGS

Two views of the chest were obtained. The prior study was reviewed from 12/06/2018.

The heart size and pulmonary vascular normal. There are postop changes of the mediastinum with
sternotomy sutures and mediastinal clips.

The lungs are clear.

There is no pneumothorax.

There is no acute bone lesion.

IMPRESSION

There is no acute radiographic abnormality chest. There are postop changes of the mediastinum.

Tech Notes:

COVID POSITIVE.  AB

## 2020-11-24 IMAGING — US UER
1 series · 2 of 2 positions shown · non-contrast
Comparison: none

[Series 1: us (person_name) ext right · 2 of 2 slices shown]
[im 1/2]
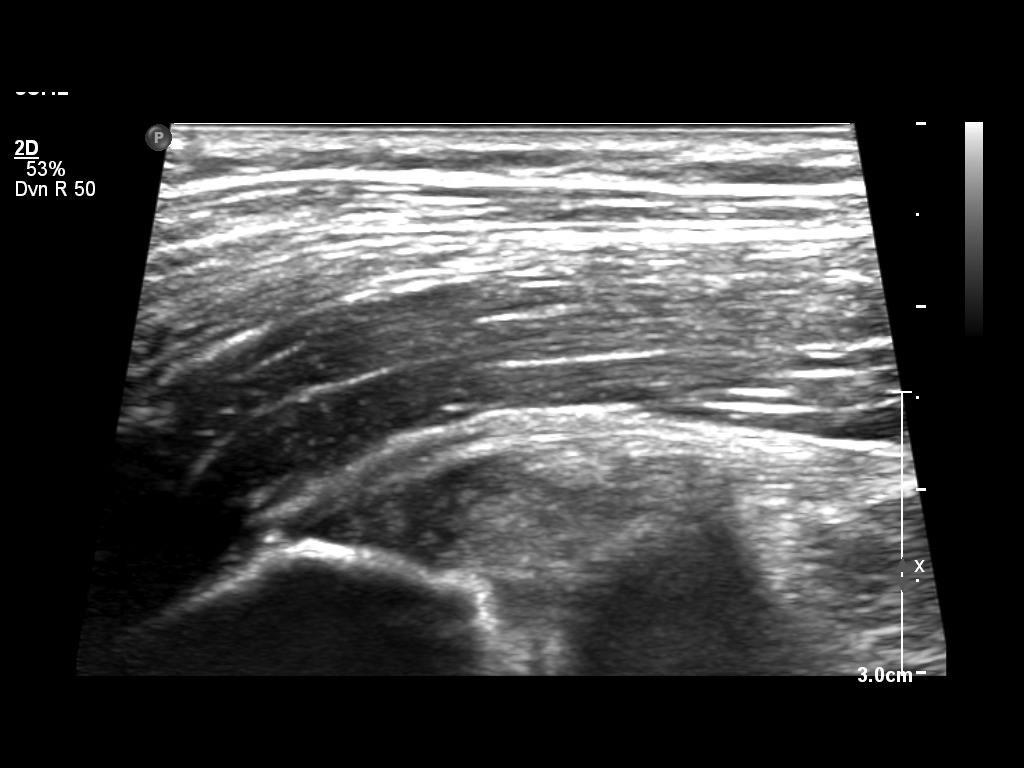
[im 2/2]
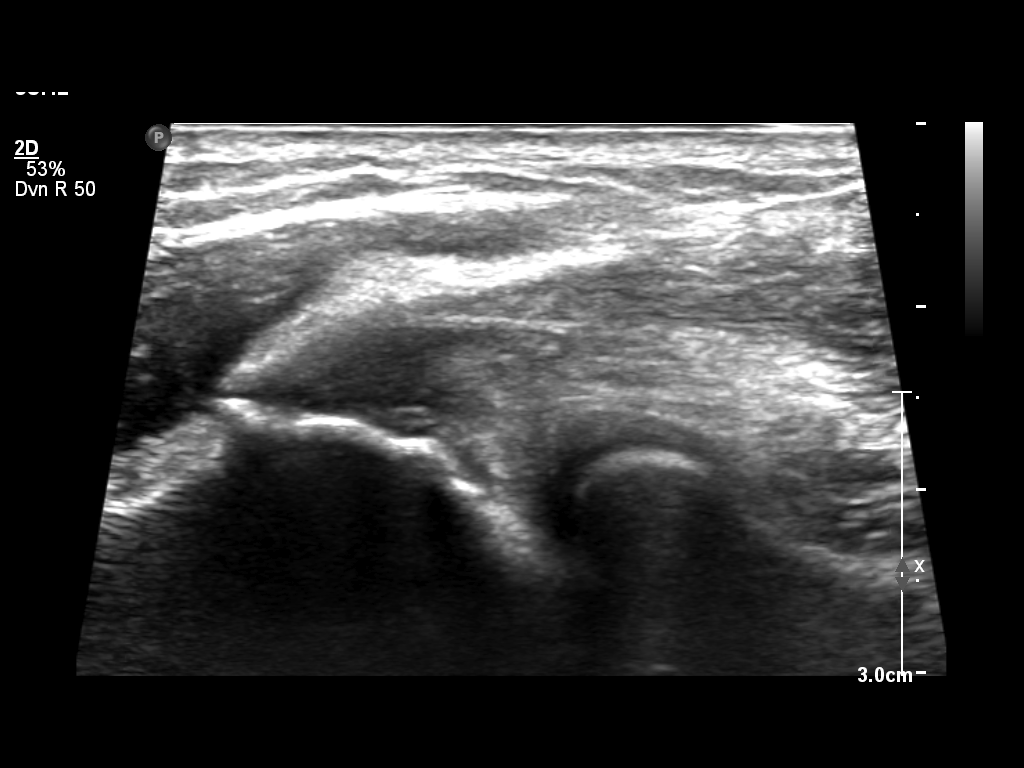

[2 of 2 positions shown; findings below may reference images not displayed]

EXAM

US Dyanes ext RT

INDICATION

Pain

TECHNIQUE

Grayscale and Color Doppler imaging evaluation in the region of concern was performed

COMPARISONS

None available at the time of dictation.

FINDINGS

Targeted ultrasound evaluation demonstrates moderate to severe tendinosis and suspicion for micro
tearing at the common extensor tendon origin. No complete or definitive current partial tear.

No elbow joint effusion.

Intact triceps tendon. Normal appearing common flexor tendon origin.

IMPRESSION
1. Moderate to severe tendinosis of the common extensor tendon origin with suspicion for chronic
microtearing.

Tech Notes:

tendinopathy of rt elbow

## 2020-12-21 IMAGING — US ABDLM
1 series · 14 of 25 positions shown · non-contrast
Comparison: none

[Series 1: us abdomen limited · 14 of 63 slices shown]
[im 1/63]
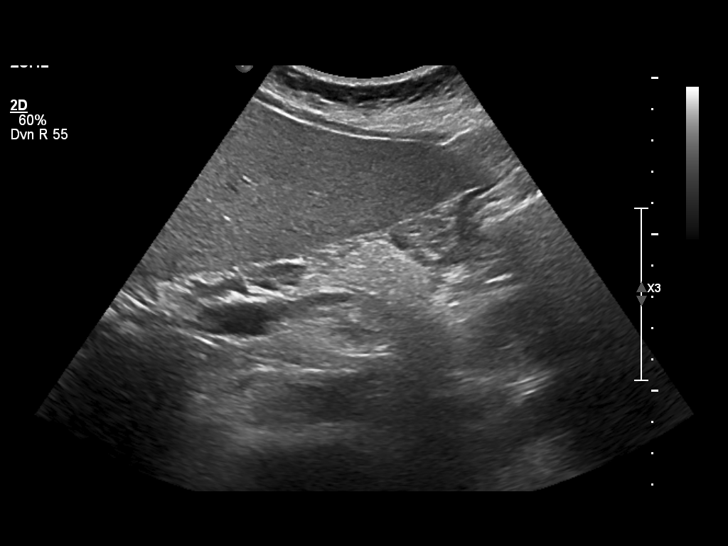
[im 6/63]
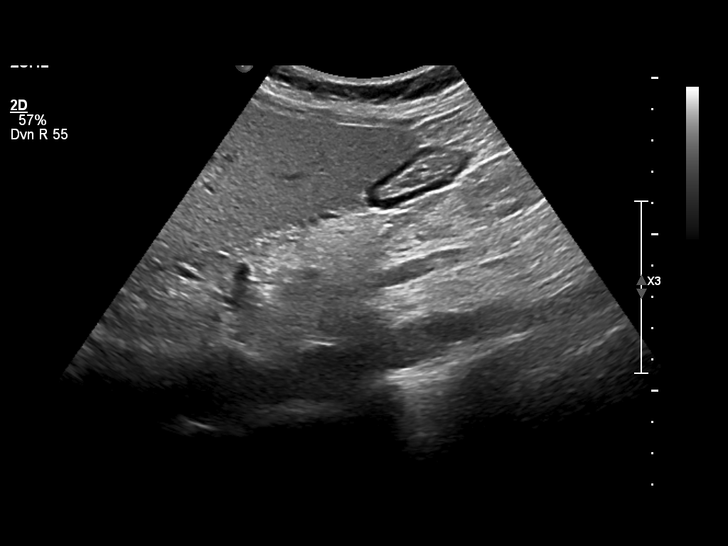
[im 11/63]
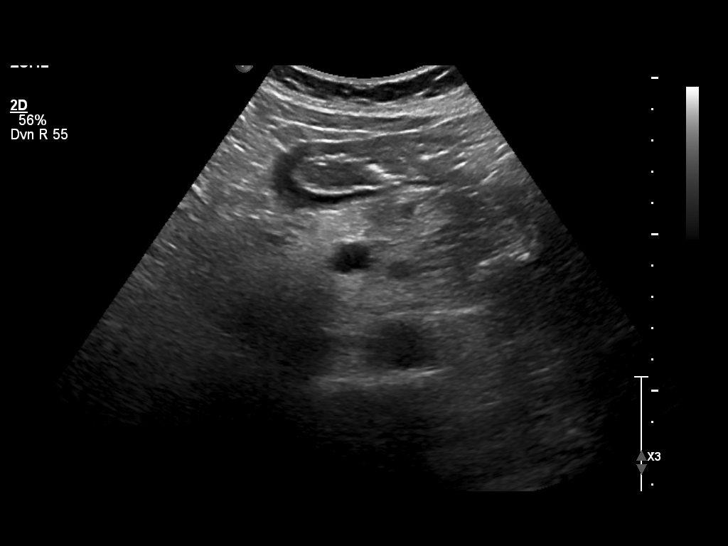
[im 16/63]
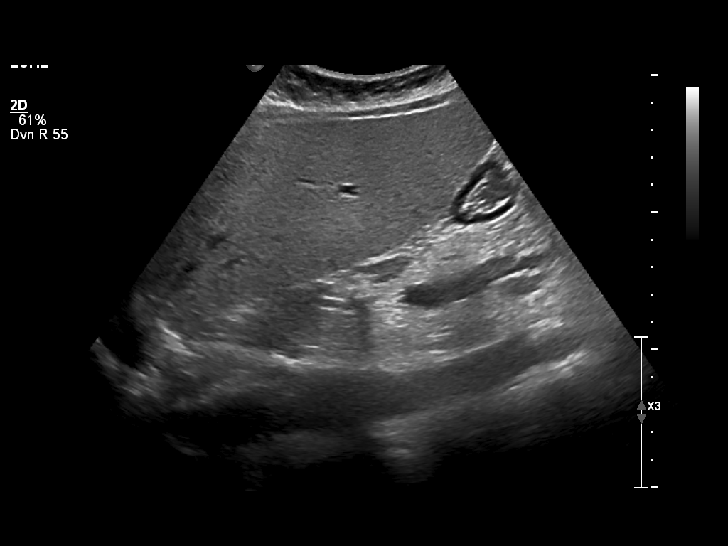
[im 21/63]
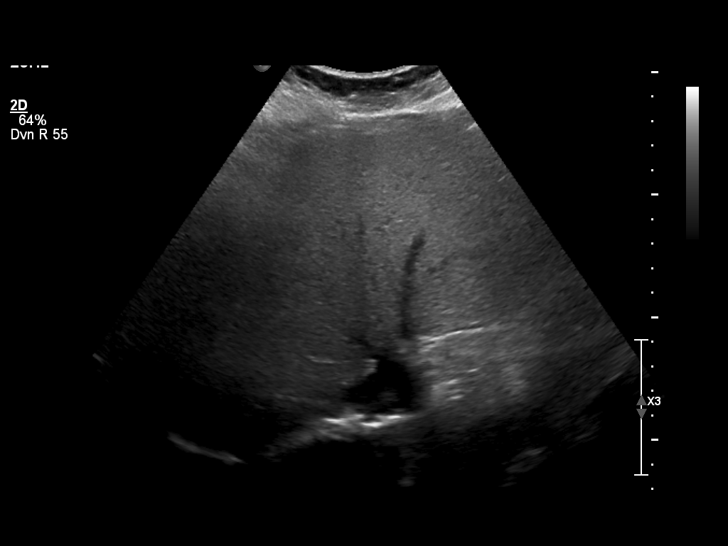
[im 24/63]
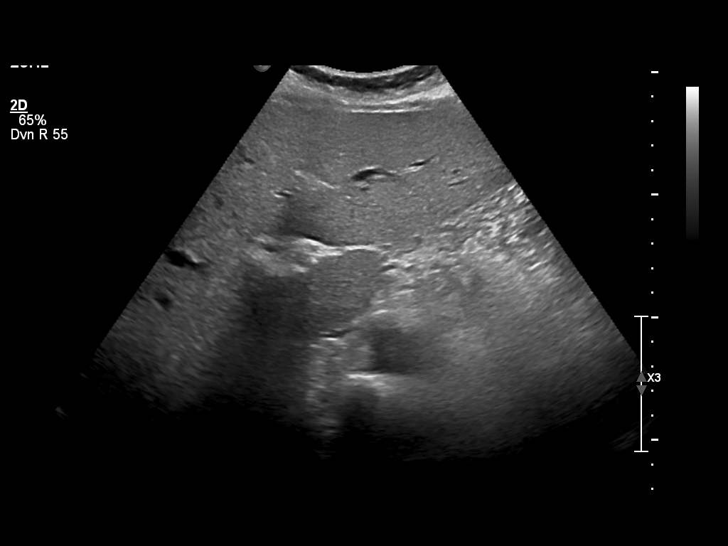
[im 29/63]
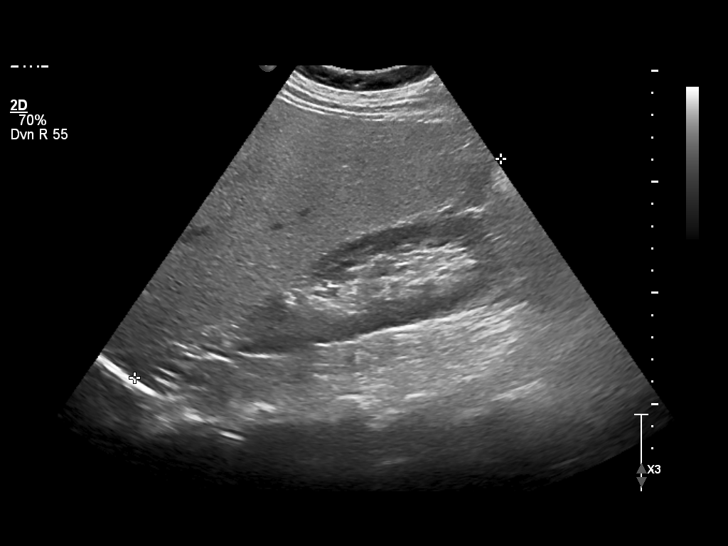
[im 34/63]
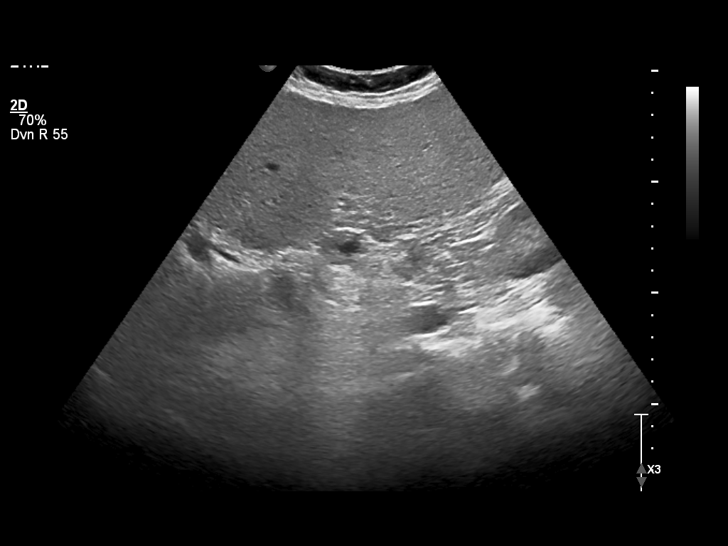
[im 39/63]
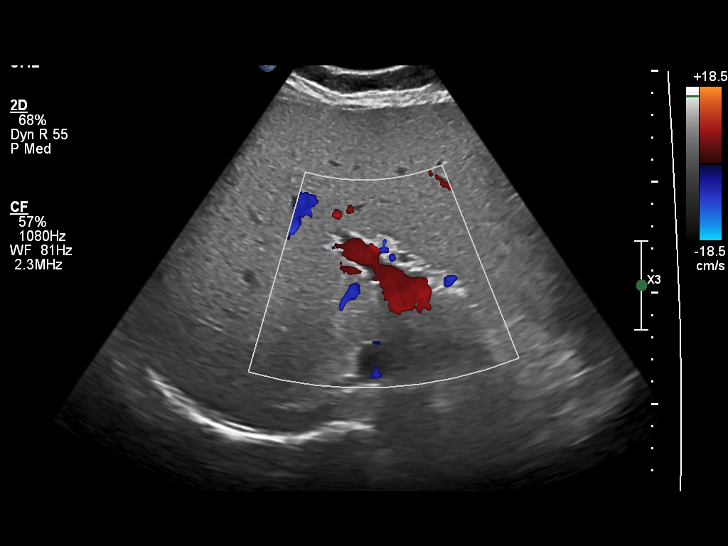
[im 42/63]
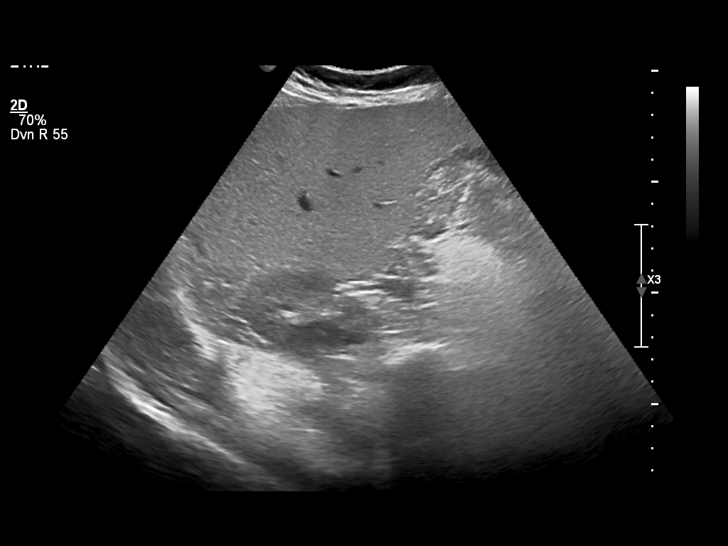
[im 47/63]
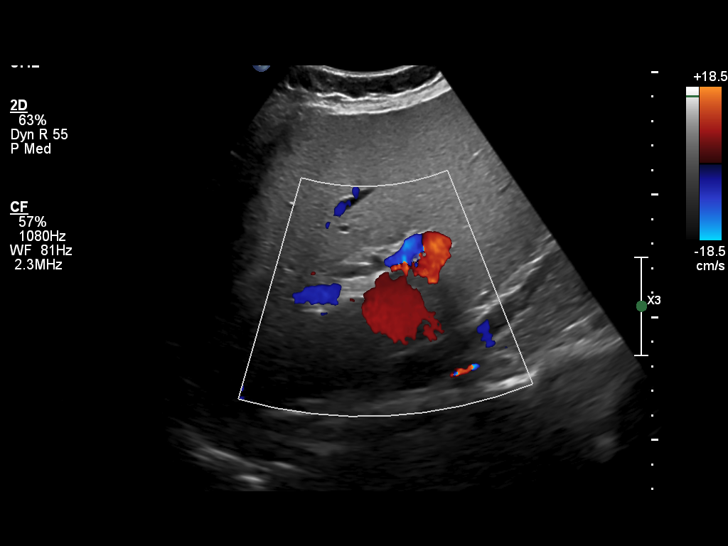
[im 52/63]
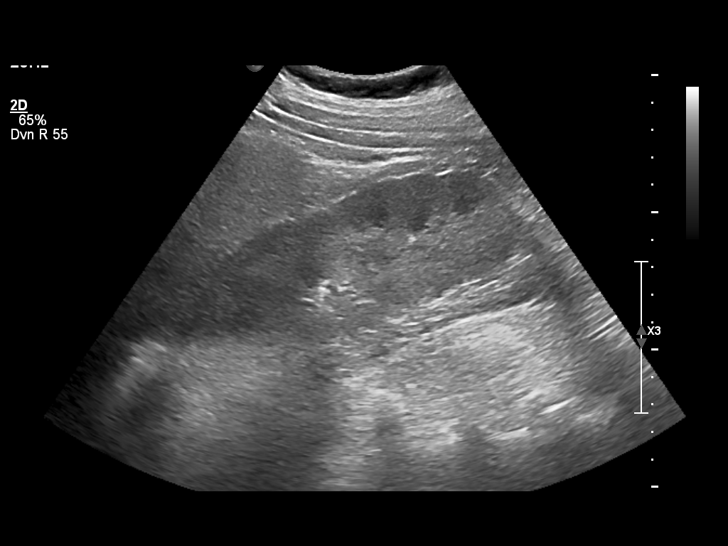
[im 57/63]
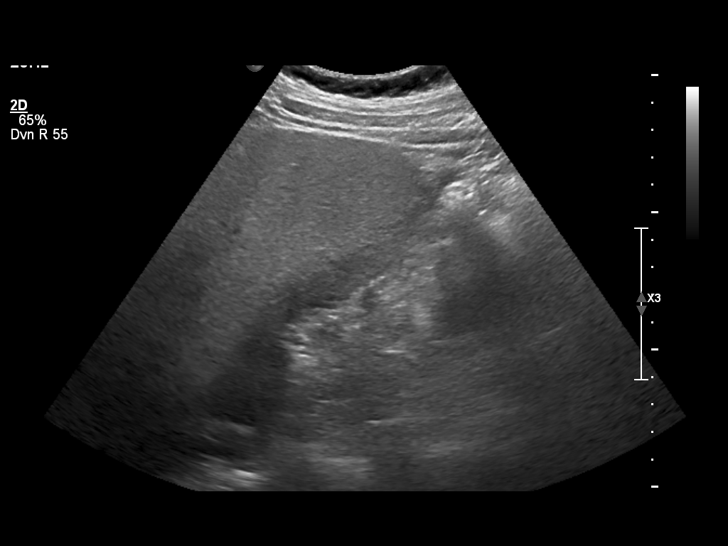
[im 63/63]
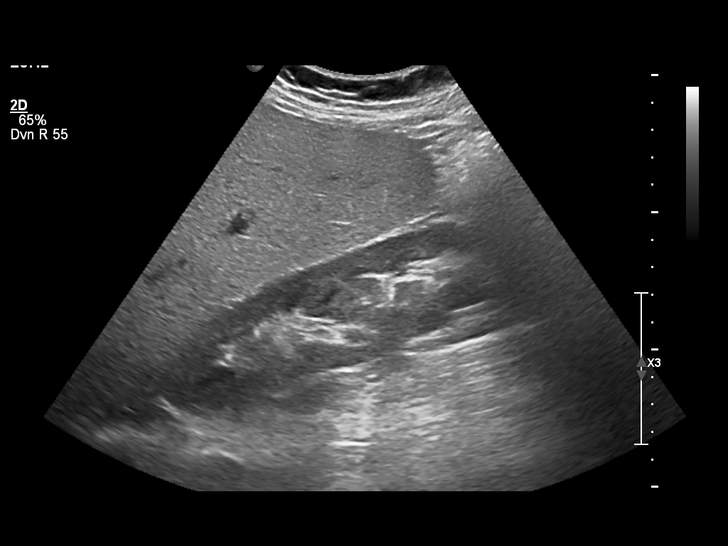

[14 of 25 positions shown; findings below may reference images not displayed]

DIAGNOSTIC STUDIES

EXAM

Abdominal ultrasound

INDICATION

LIVER
liver

TECHNIQUE

Limited abdominal ultrasound was obtained.

COMPARISONS

None available

FINDINGS

Visualized aorta and pancreas are within normal limits.

Prior cholecystectomy. Liver is enlarged with diffuse hepatic steatosis. Common bile duct measures
4.1 millimeters.

Right kidney is normal measuring 13.6 cm in length.

IMPRESSION

Prior cholecystectomy.

Hepatomegaly and hepatic steatosis.

Tech Notes:

liver

## 2020-12-30 IMAGING — CR ELBOWCMRT
3 series · 3 of 3 positions shown · non-contrast
Comparison: none

[elbow ap]
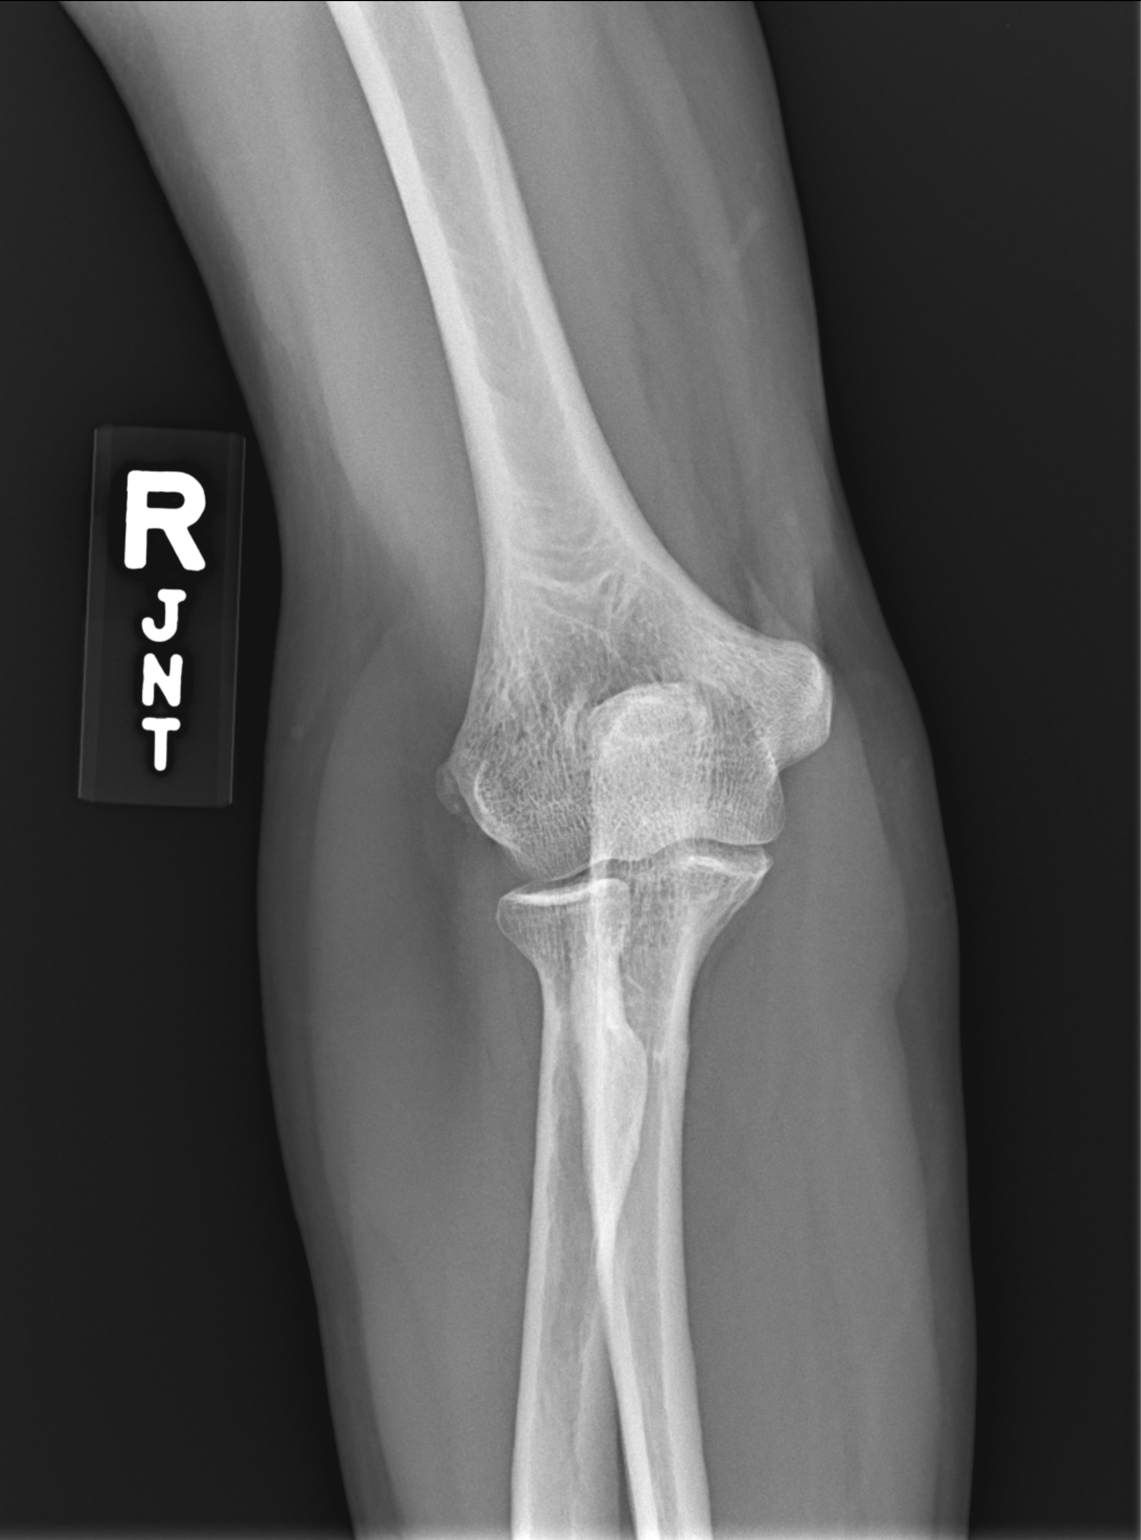

[elbow lat]
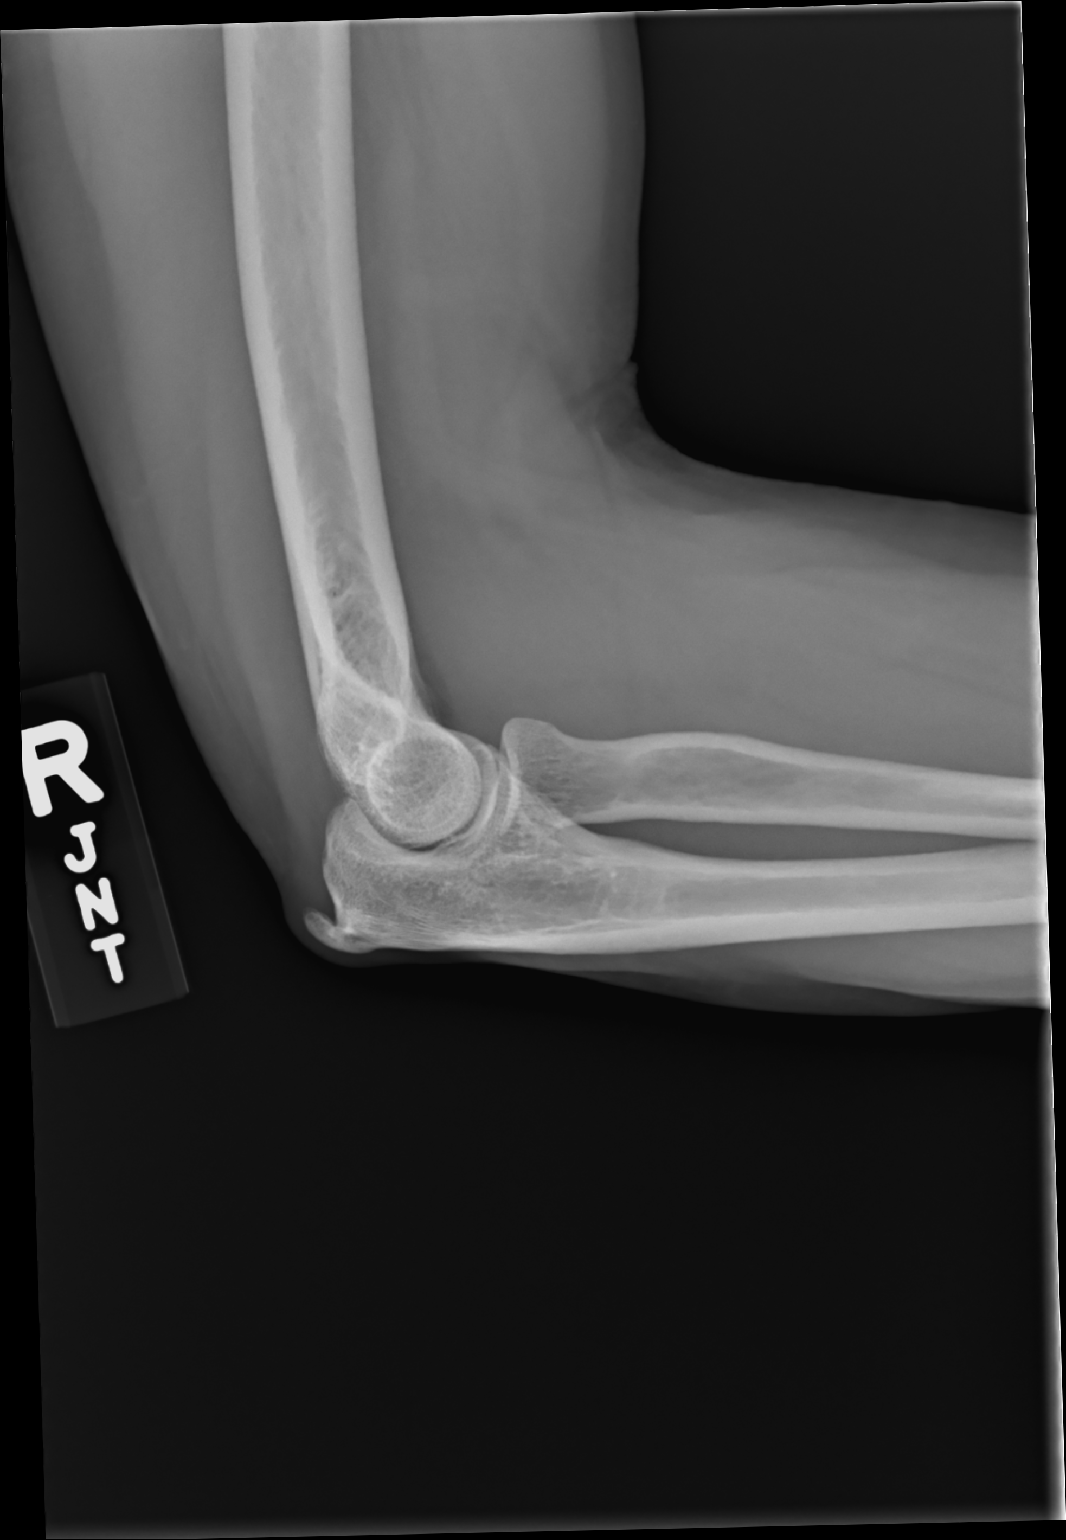

[elbow obl]
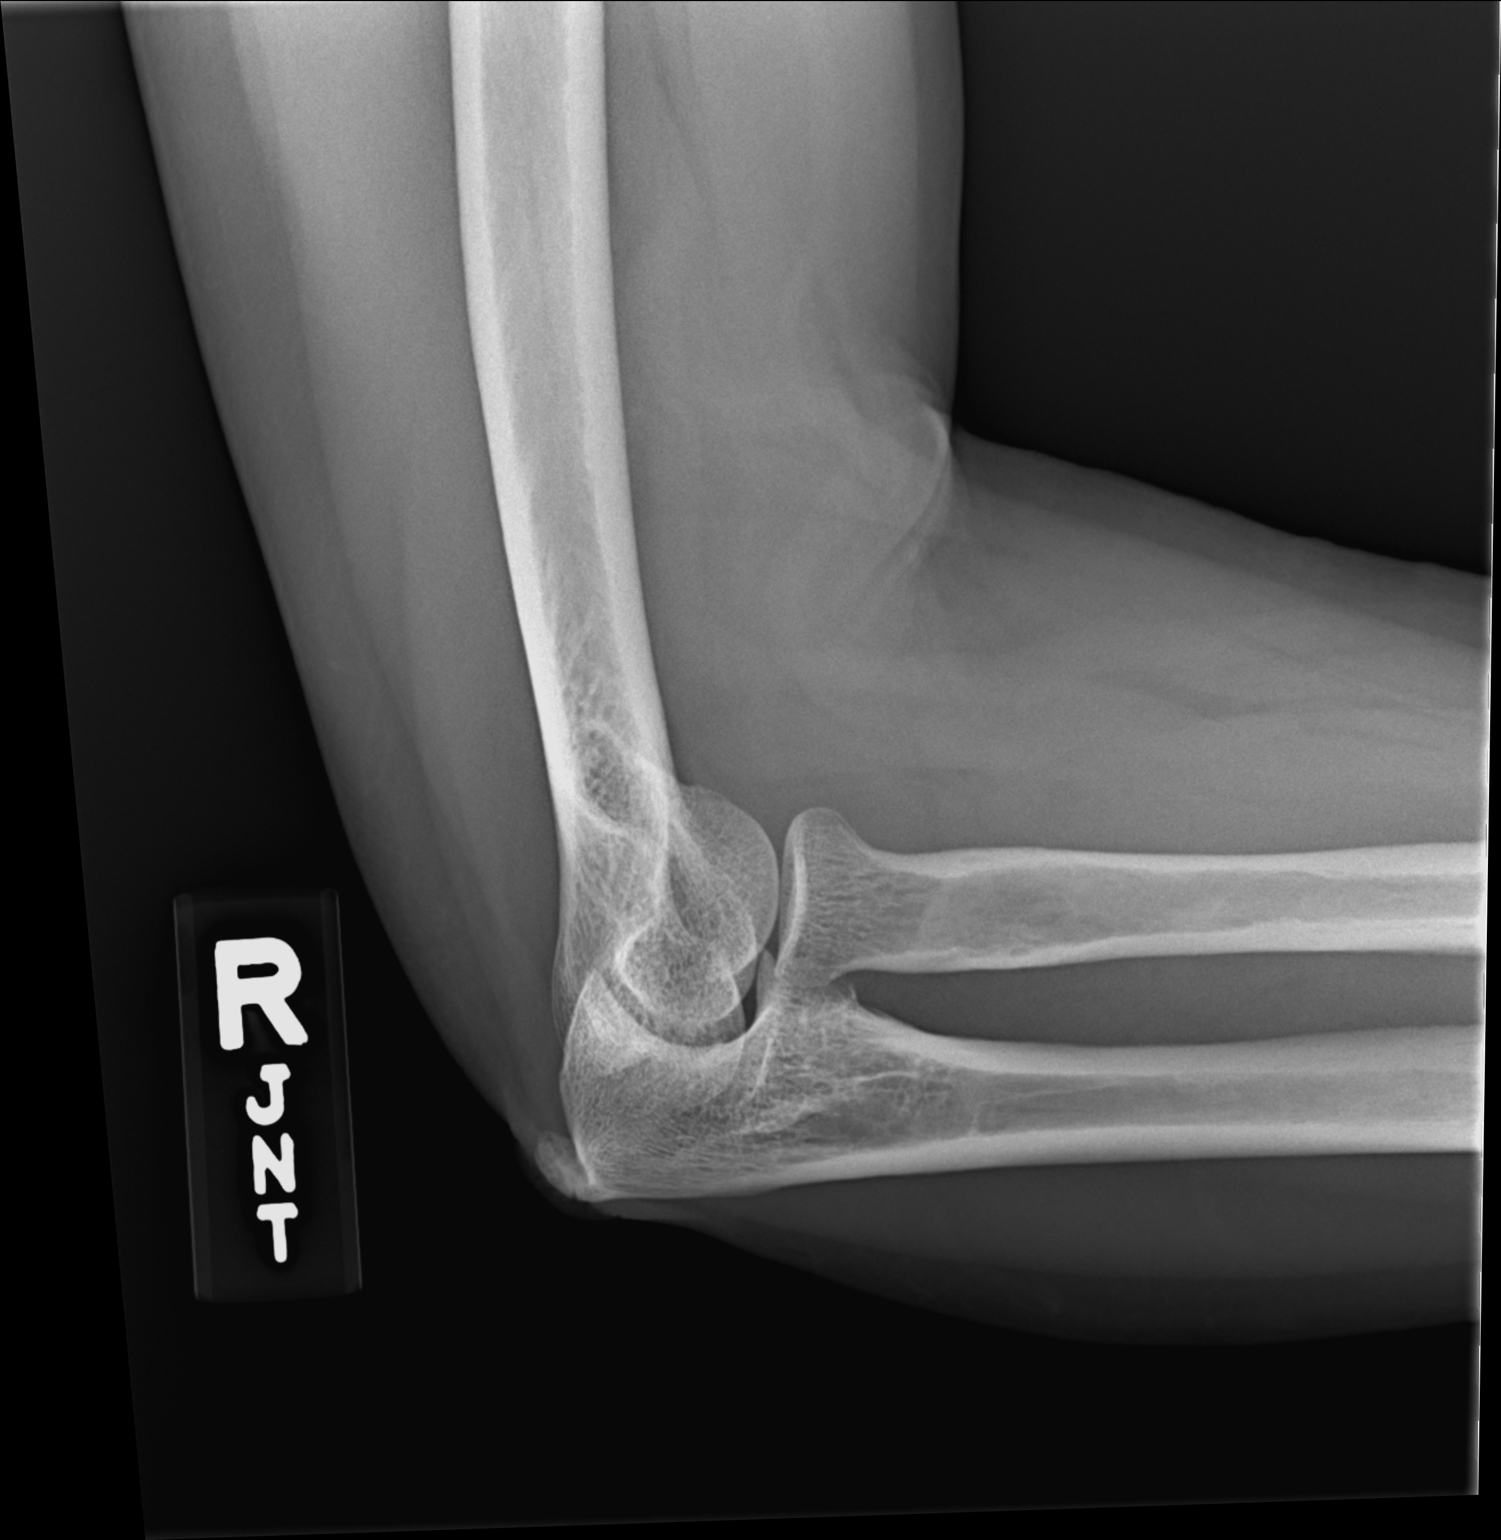

[3 of 3 positions shown; findings below may reference images not displayed]

DIAGNOSTIC STUDIES

EXAM

XR elbow RT min 3V

INDICATION

elbow pain
Right elbow pain. Currently wears brace for Dx of tennis elbow. Recently has been having more pain
on the back of elbow. The AP view caused a lot of pain for pt. CS/JT

TECHNIQUE

AP lateral and oblique views

COMPARISONS

None available

FINDINGS

No fractures or dislocations are seen. There is spurring at the olecranon.

IMPRESSION

No fractures or dislocation. Osteophytic spurring is seen off the olecranon and lateral humeral
epicondyle.

Tech Notes:

Right elbow pain. Currently wears brace for Dx of tennis elbow. Recently has been having more pain
on the back of elbow. The AP view caused a lot of pain for pt. CS/JT

## 2021-01-18 IMAGING — MR C-spine^Routine
5 series · 42 of 48 positions shown · non-contrast
Comparison: none

[Series 2: T2 · sagittal · 3.0mm · 0.54mm/px · 8 of 13 slices shown]
[im 1/13]
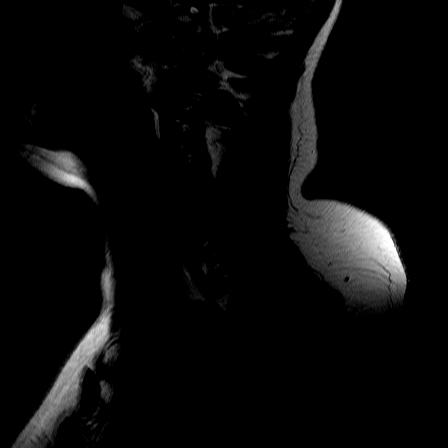
[im 2/13]
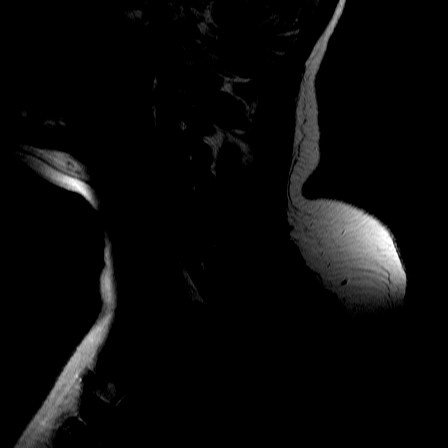
[im 4/13]
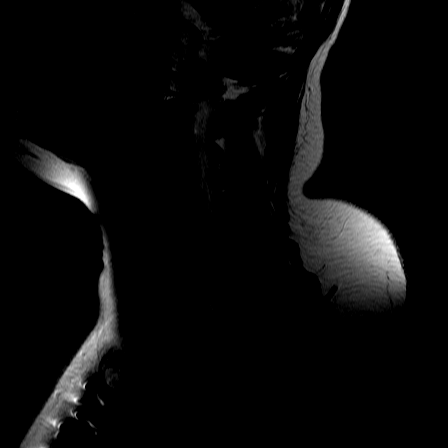
[im 6/13]
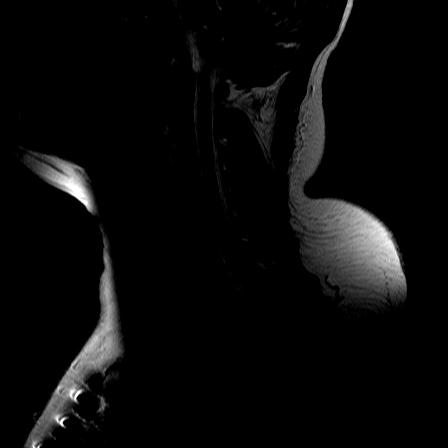
[im 7/13]
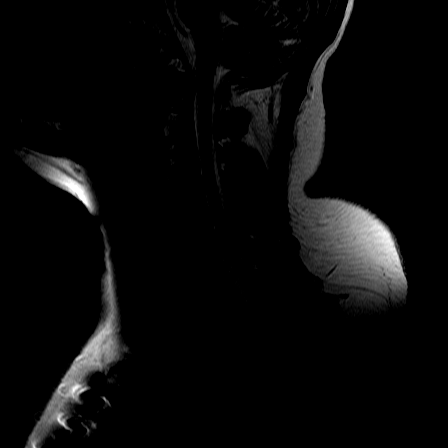
[im 9/13]
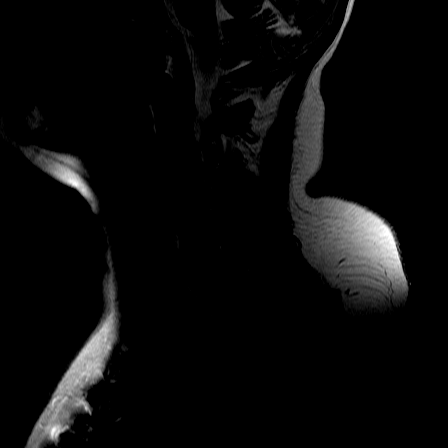
[im 11/13]
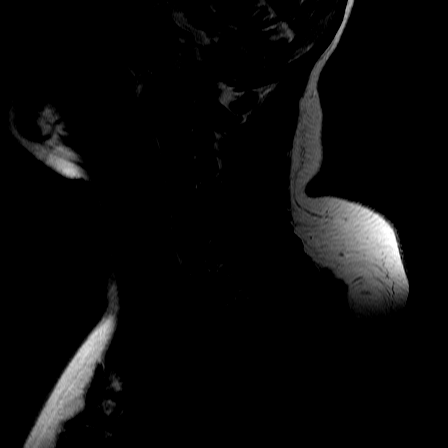
[im 13/13]
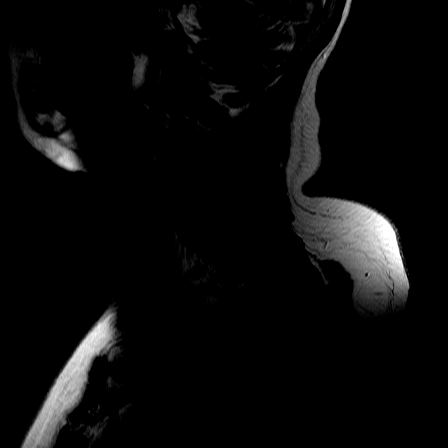

[Series 3: T1 · sagittal · 3.0mm · 0.75mm/px · 7 of 13 slices shown (1 of 2)]
[im 1/13]
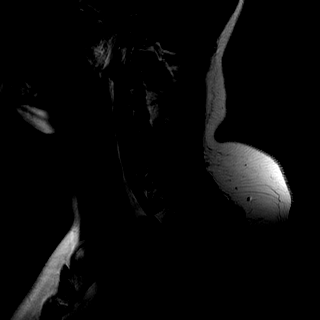
[im 3/13]
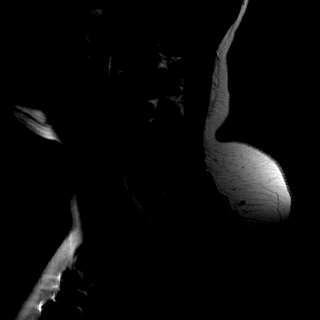
[im 5/13]
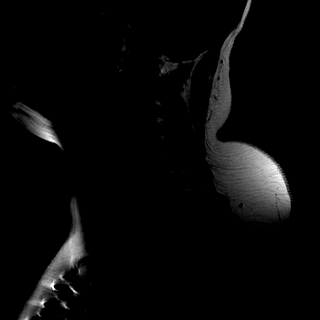
[im 7/13]
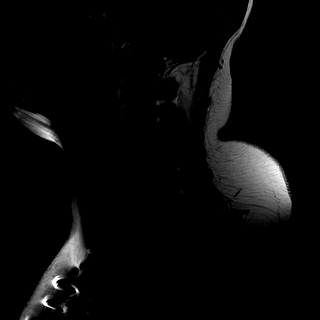
[im 9/13]
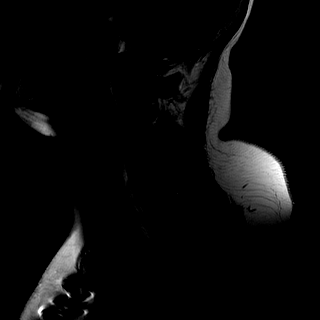
[im 11/13]
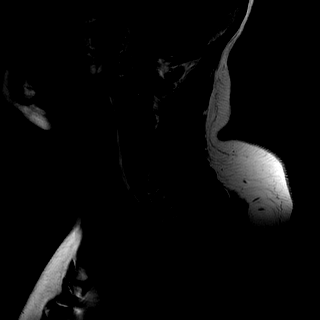
[im 13/13]
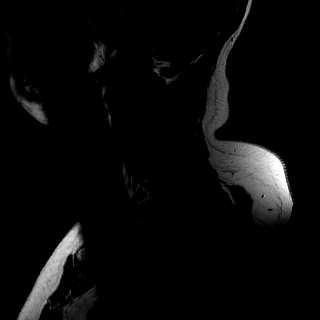

[Series 4: STIR · sagittal · 3.0mm · 0.94mm/px · 7 of 13 slices shown]
[im 1/13]
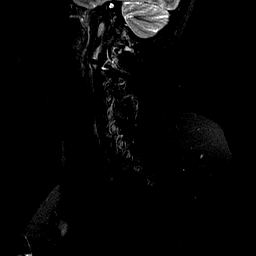
[im 3/13]
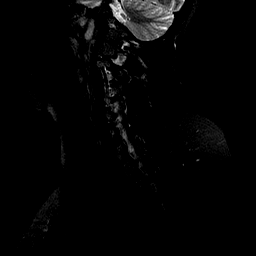
[im 5/13]
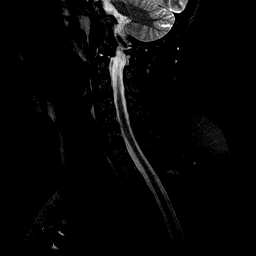
[im 7/13]
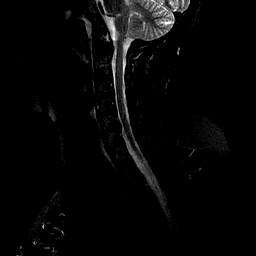
[im 9/13]
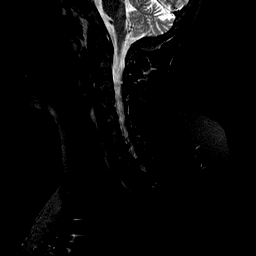
[im 11/13]
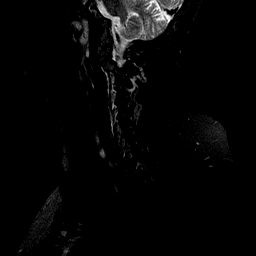
[im 13/13]
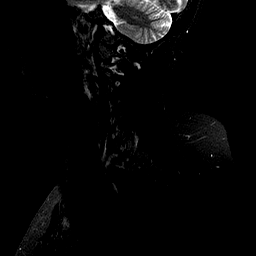

[Series 5: provider echo axial · axial · 3.0mm · 0.39mm/px · z∈[-45,+42]mm · 8 of 24 slices shown]
[im 1/24]
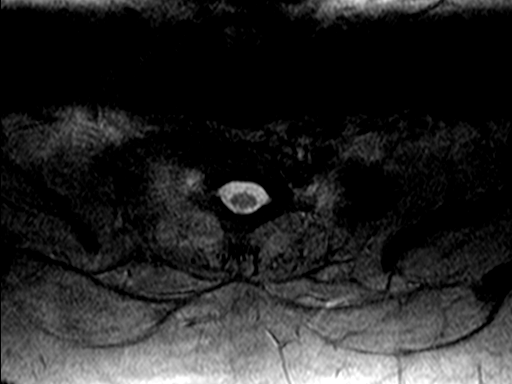
[im 4/24]
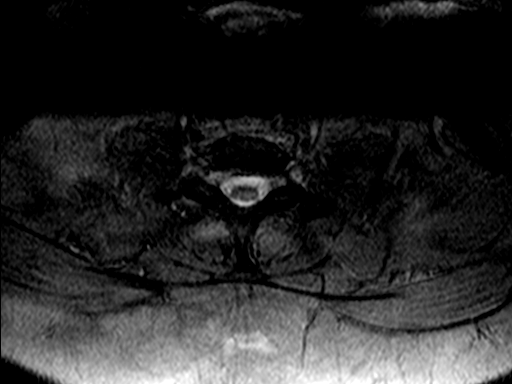
[im 8/24]
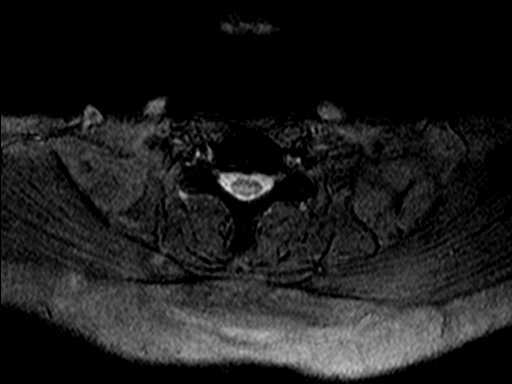
[im 10/24]
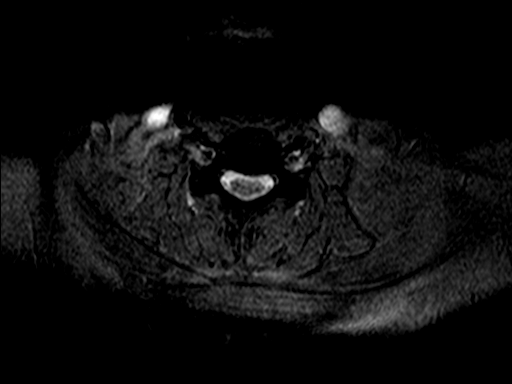
[im 14/24]
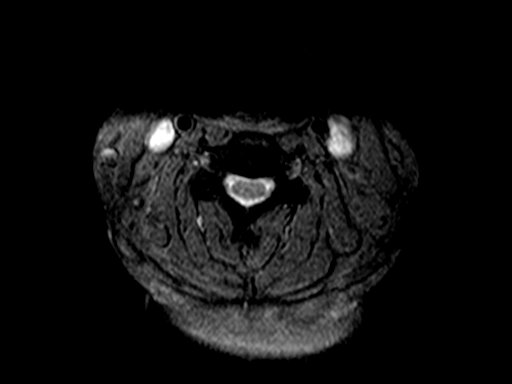
[im 16/24]
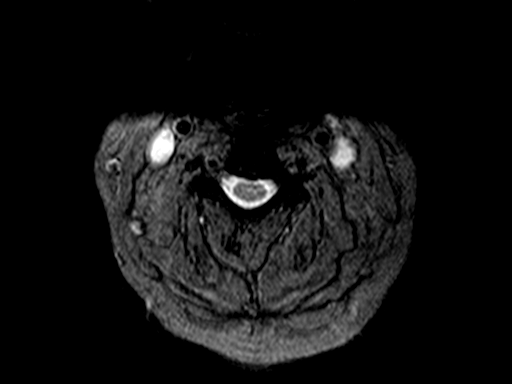
[im 20/24]
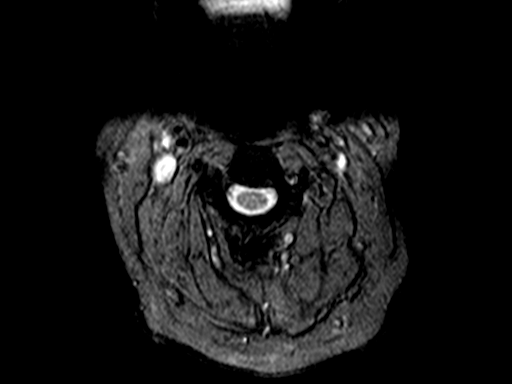
[im 24/24]
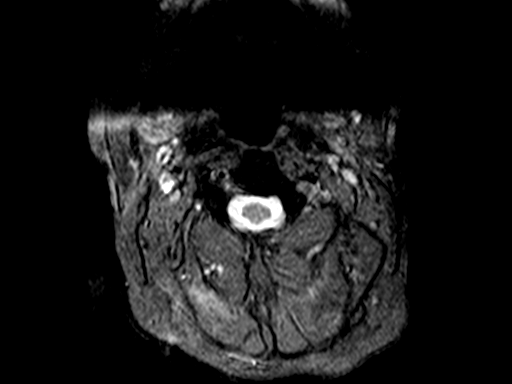

[Series 6: T1 · axial · 3.0mm · 0.78mm/px · z∈[-45,+42]mm · 12 of 24 slices shown (2 of 2)]
[im 1/24]
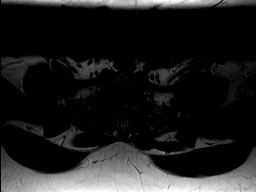
[im 2/24]
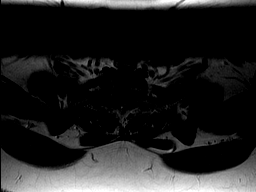
[im 4/24]
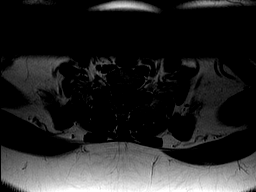
[im 6/24]
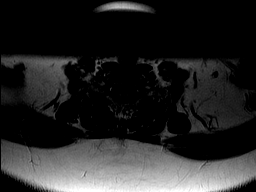
[im 8/24]
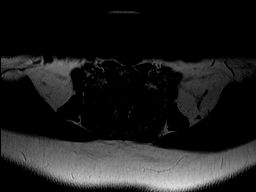
[im 10/24]
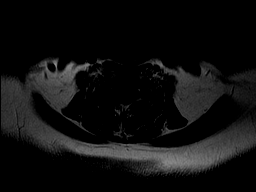
[im 12/24]
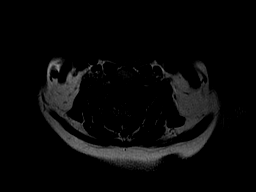
[im 14/24]
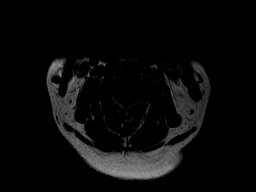
[im 16/24]
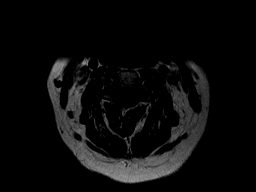
[im 18/24]
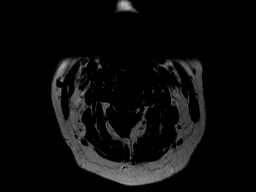
[im 20/24]
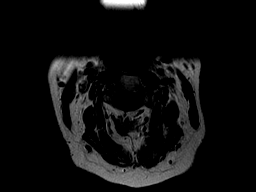
[im 24/24]
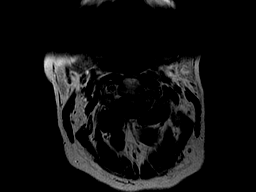

[42 of 48 positions shown; findings below may reference images not displayed]

EXAM

MR cervical spine wo con

INDICATION

Pain

TECHNIQUE

Multiplanar, multisequence imaging of the cervical spine without contrast.

COMPARISONS

None available at the time of dictation.

FINDINGS

ANATOMY: Normal cervical lordosis. No spondylolisthesis.

VERTEBRAL BODIES: No endplate compression fracture. No significant endplate degenerative change.

SPINAL CANAL: No significant spinal canal narrowing.

INTERVERTEBRAL DISCS/UNCOVERTEBRAL JOINTS: Diffuse disc dessication. No significant disc
herniation.

FACETS: No significant facet arthropathy.

C2-C3: Unremarkable.

C3-C4: Unremarkable.

C4-C5: Unremarkable

C5-C6: A very subtle disc bulge is seen.

C6-C7: Unremarkable.

C7-T1: Unremarkable.

OTHER: No abnormality of the visualized abdomen/pelvis.

IMPRESSION

Very minimal degenerative change. No significant neural foraminal or spinal canal stenosis.

Tech Notes:

SHOOTING PAIN DOWN RT ARM STARTING AT NECK, WORSE AFTER OVER USE.  RG

## 2021-01-27 IMAGING — MG MAMMOGRAM 3D SCREEN, BILATERAL
8 series · 8 of 8 positions shown · non-contrast
Comparison: none

[R CC]
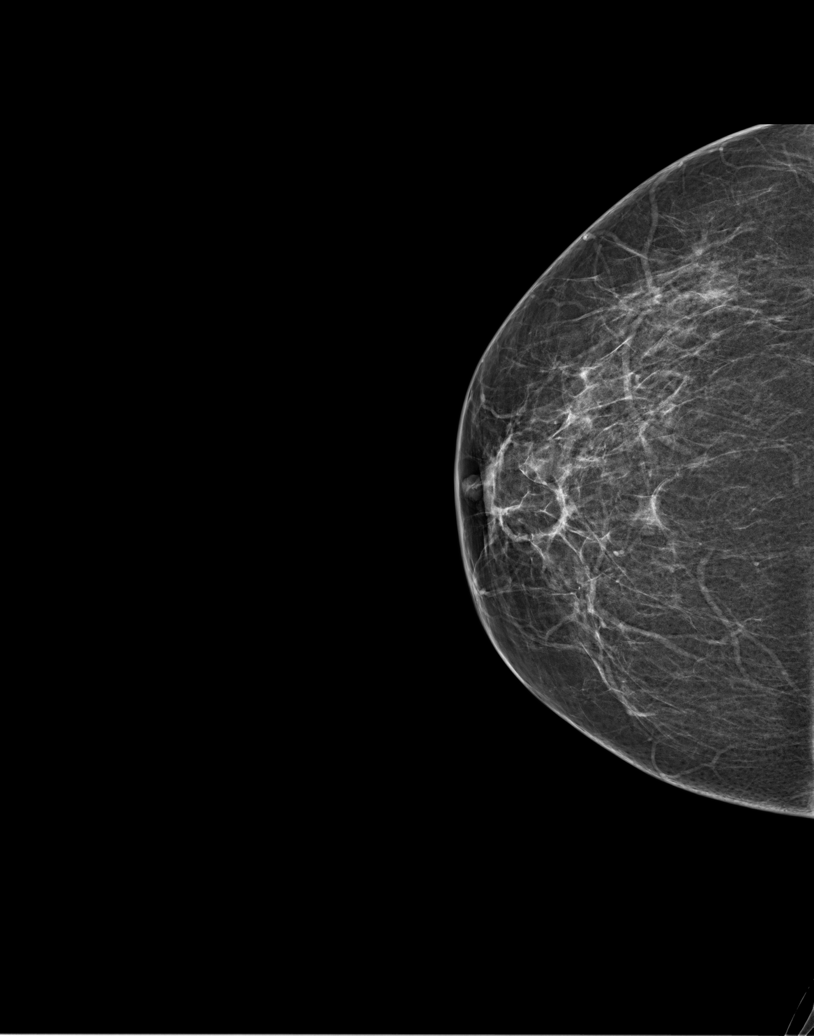

[R tomo (1 of 2)]
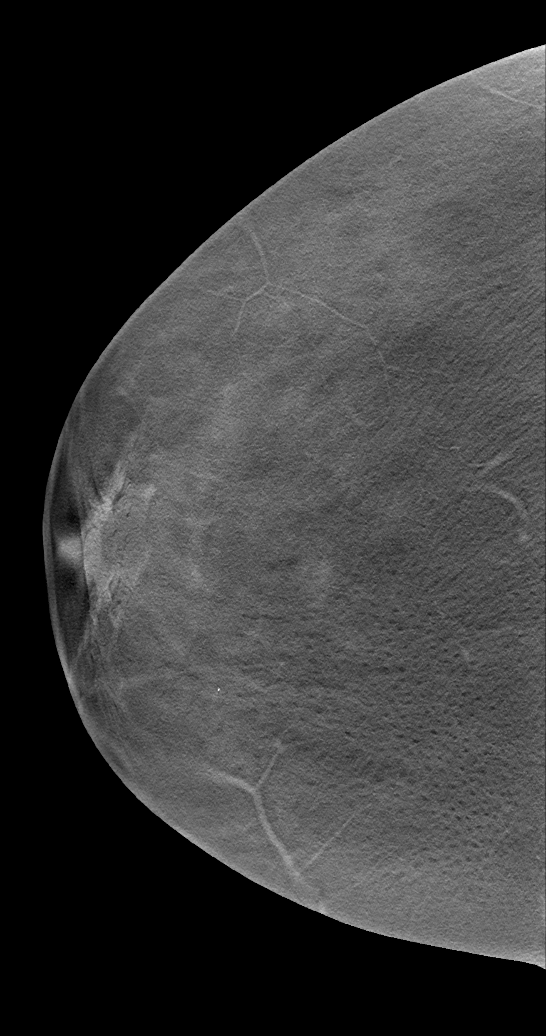

[L CC]
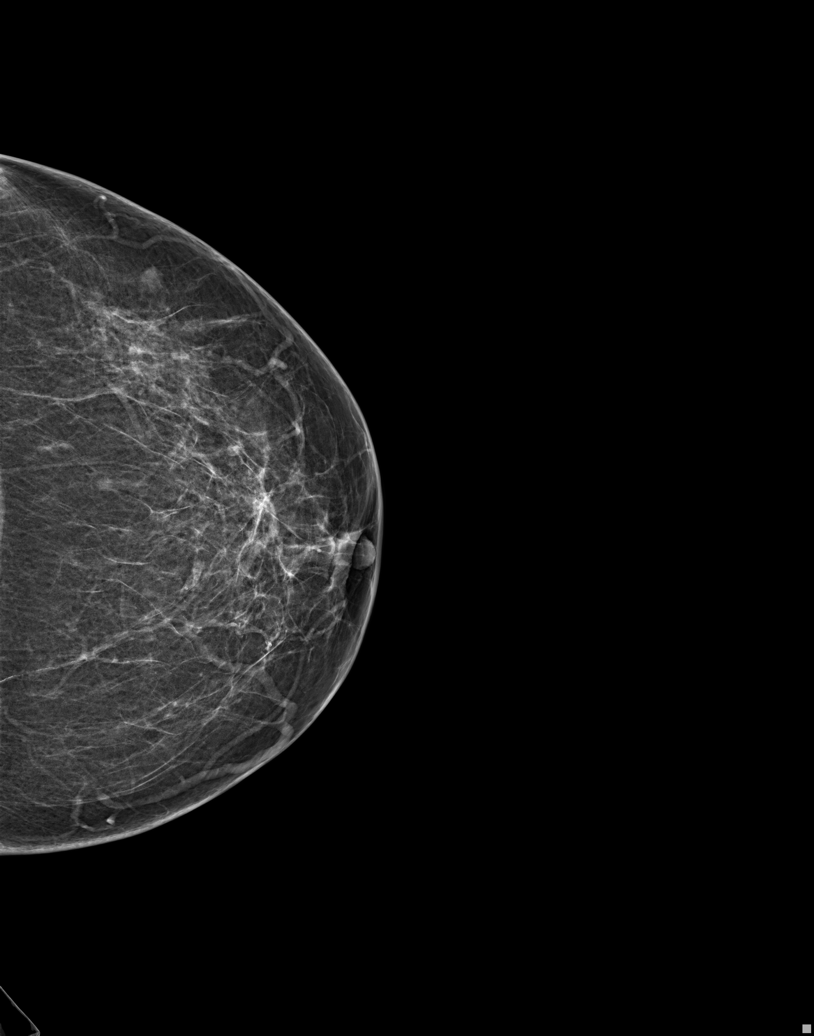

[L tomo (1 of 2)]
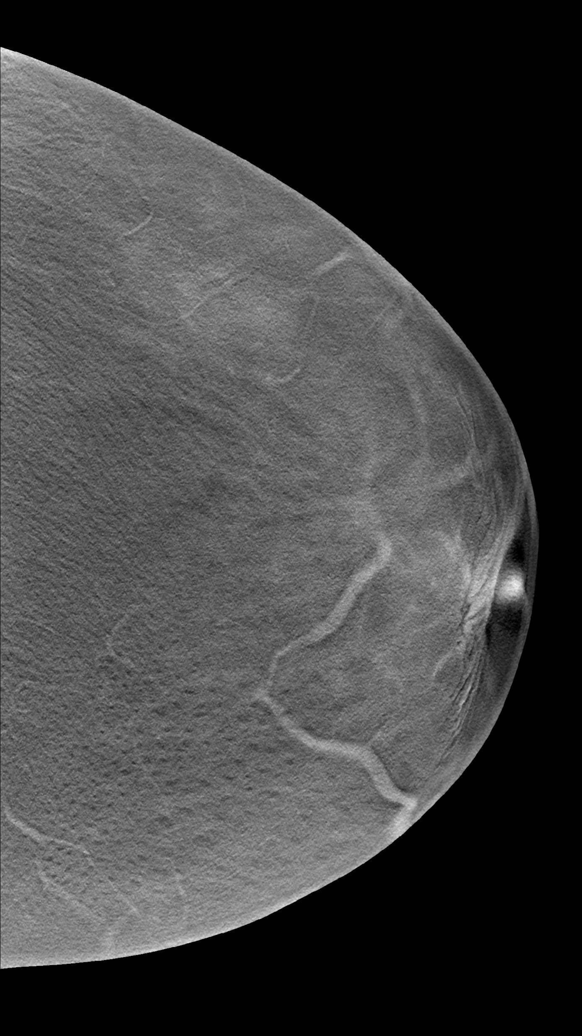

[L MLO]
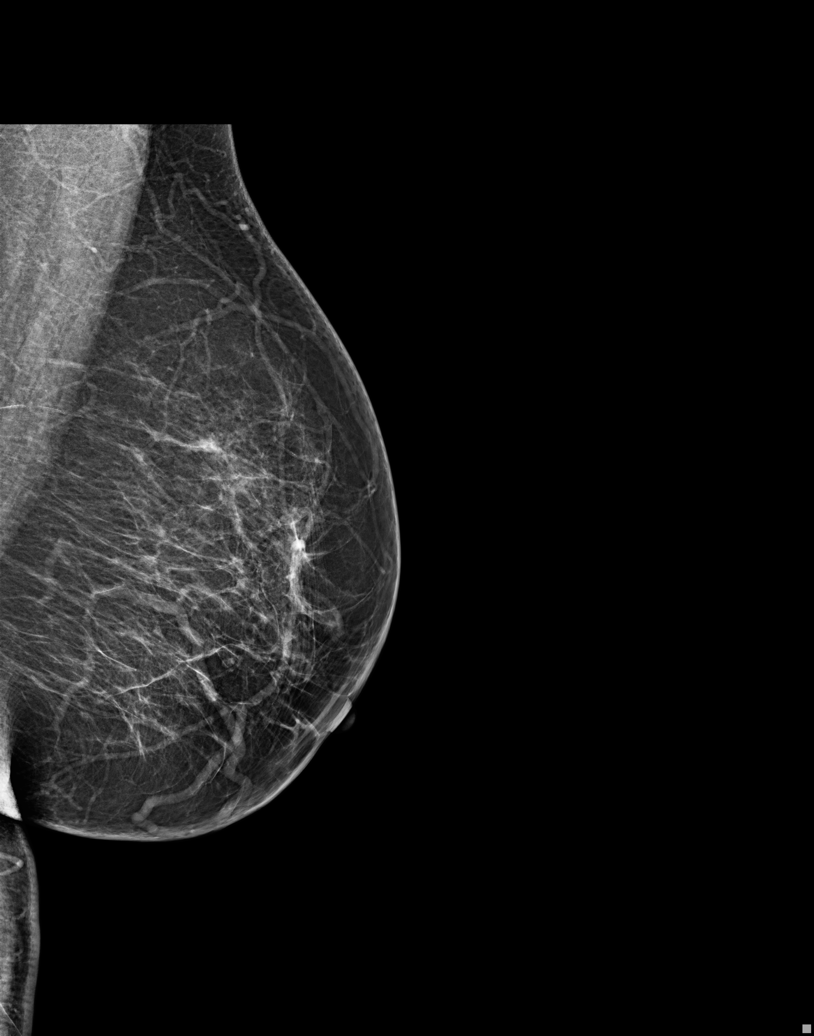

[L tomo (2 of 2)]
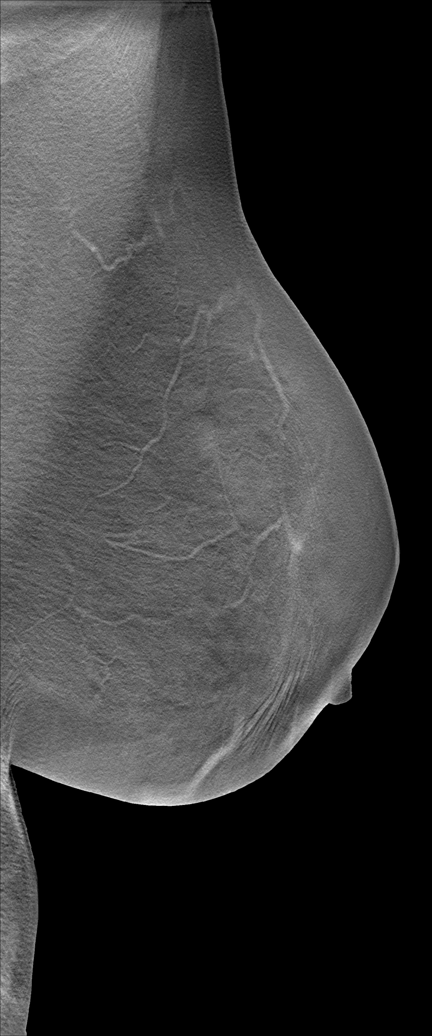

[R MLO]
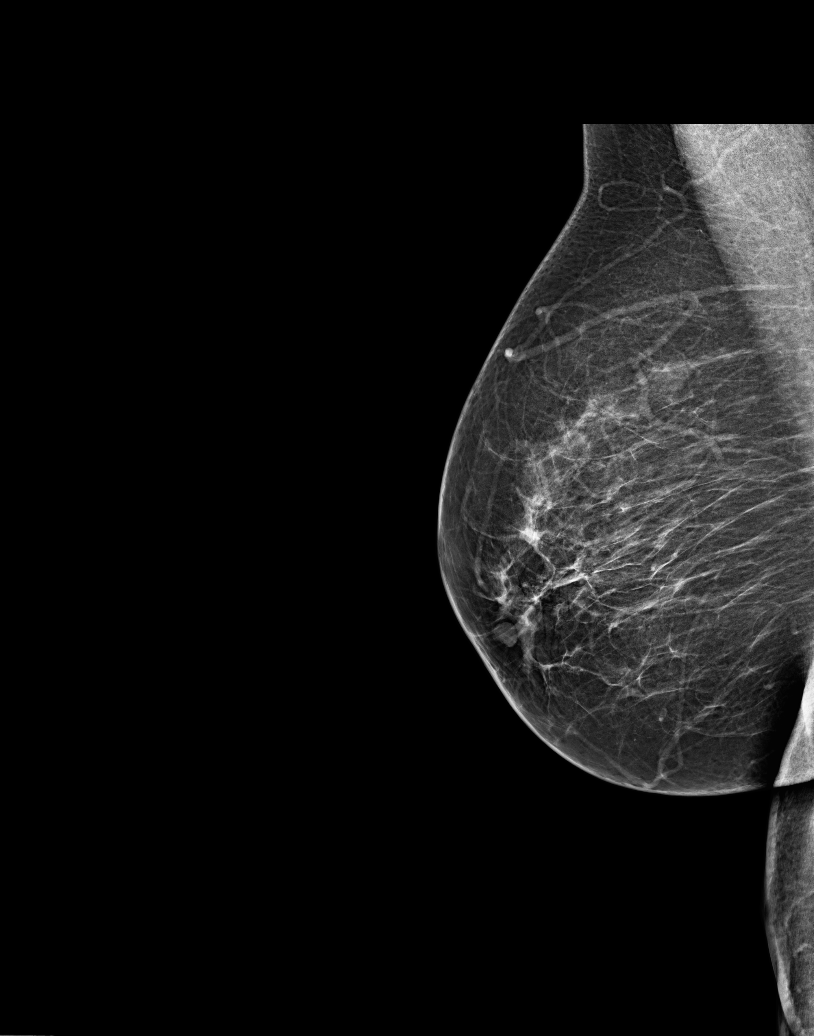

[R tomo (2 of 2)]
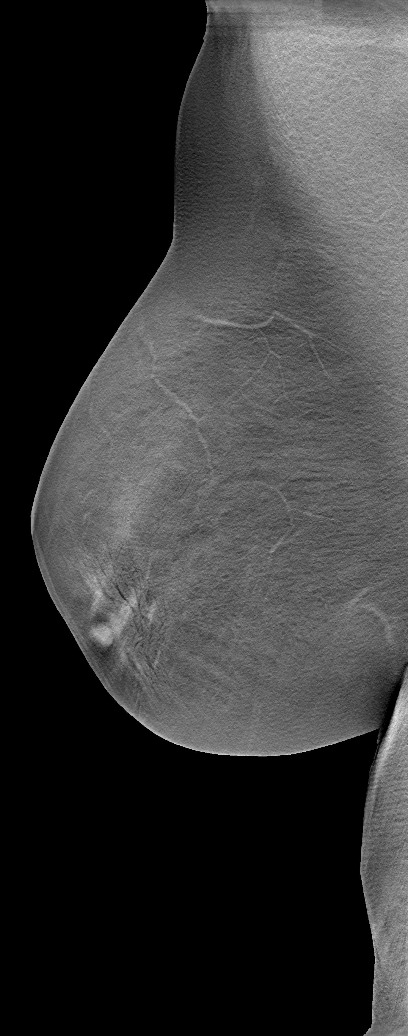

[8 of 8 positions shown; findings below may reference images not displayed]

EXAM

3D SCREENING MAMMOGRAM, BILATERAL

INDICATION

screen
baseline screening, no issues. KF 3D. sister breast ca, maternal cousin ovarian ca

TECHNIQUE

Digital 2D CC and MLO projections obtained with 3D tomographic views per manufacturer's protocol.
ICAD version 7.2 was used during this exam.

COMPARISONS

None

FINDINGS

There are heterogeneous elements of fibroglandular breast tissue, which can obscure small masses.

Asymmetries seen in the mid to deep right breast along the posterior nipple line in CC view only. A
focal asymmetry seen in the left breast upper outer quadrant, posterior depth. A focal asymmetry is
seen in the mid left breast outer lower quadrant.

IMPRESSION

Recommend spot-compression with possible ultrasound of the breasts bilaterally.

BI-RADS O : Incomplete exam. Recomend additional imaging.

Tech Notes:

## 2021-02-16 IMAGING — MR T-spine^Routine
8 of 11 series · 36 of 48 positions shown · non-contrast
Comparison: none

[Series 3: T2 · sagittal · 3.0mm · 0.62mm/px · 4 of 15 slices shown (1 of 4)]
[im 1/15]
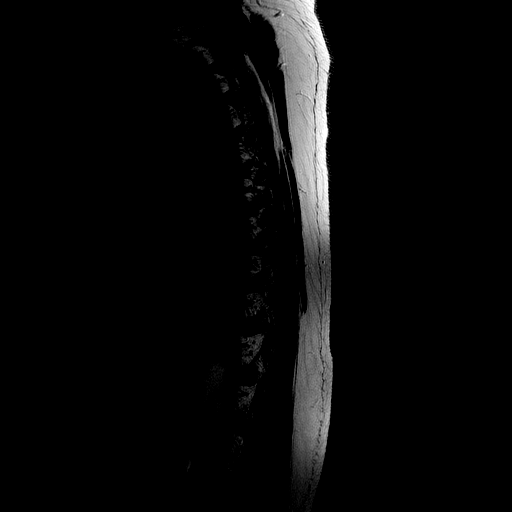
[im 5/15]
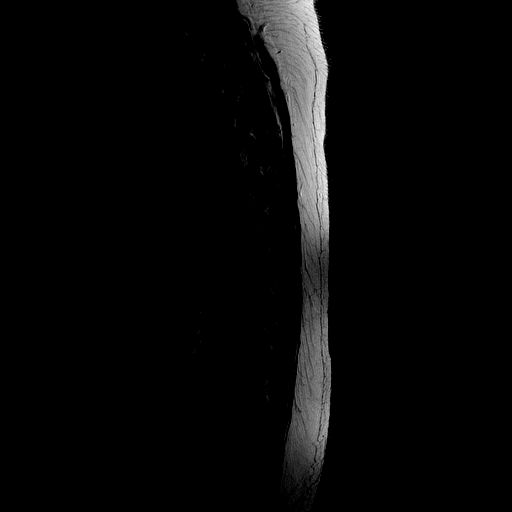
[im 10/15]
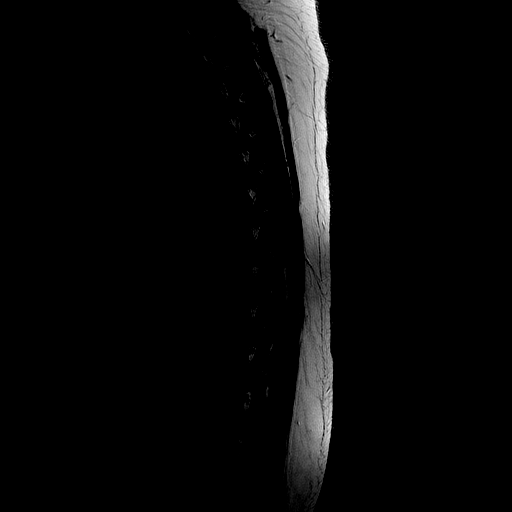
[im 15/15]
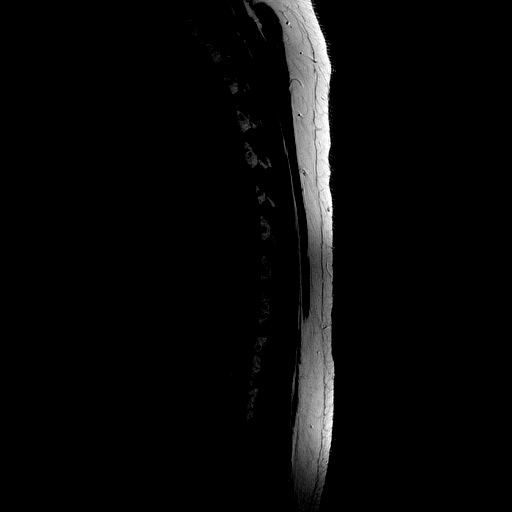

[Series 4: T1 · sagittal · 3.0mm · 0.71mm/px · 4 of 15 slices shown (1 of 4)]
[im 1/15]
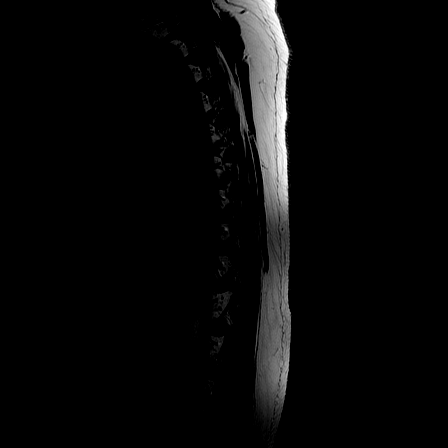
[im 5/15]
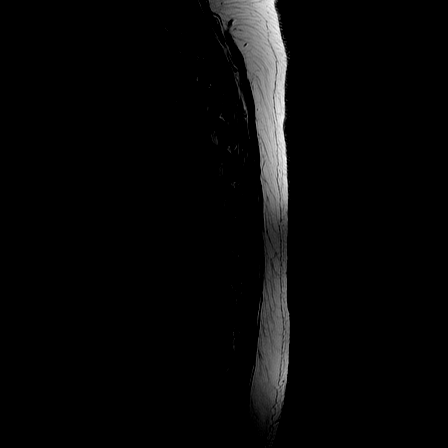
[im 10/15]
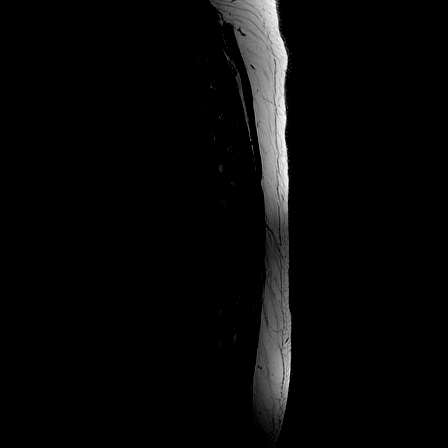
[im 15/15]
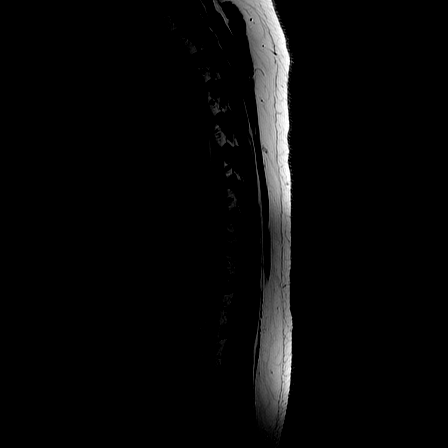

[Series 5: T2 · sagittal · 3.0mm · 0.62mm/px · 4 of 15 slices shown (2 of 4)]
[im 1/15]
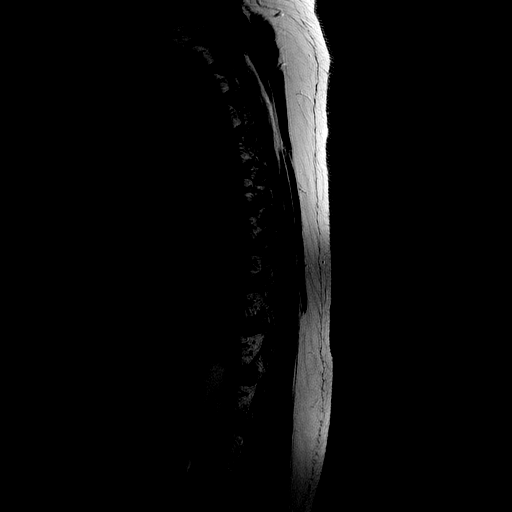
[im 5/15]
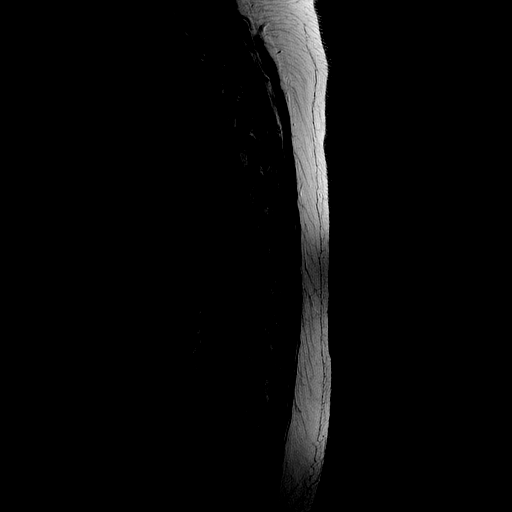
[im 10/15]
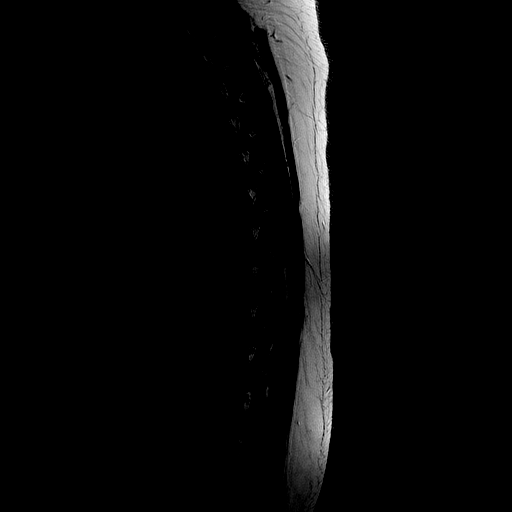
[im 15/15]
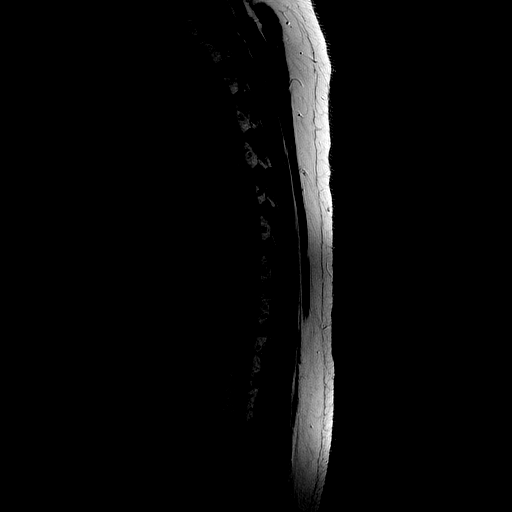

[Series 7: T1 · sagittal · 3.0mm · 0.71mm/px · 4 of 15 slices shown (2 of 4)]
[im 1/15]
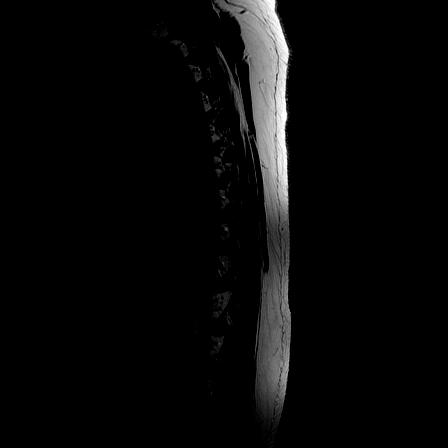
[im 5/15]
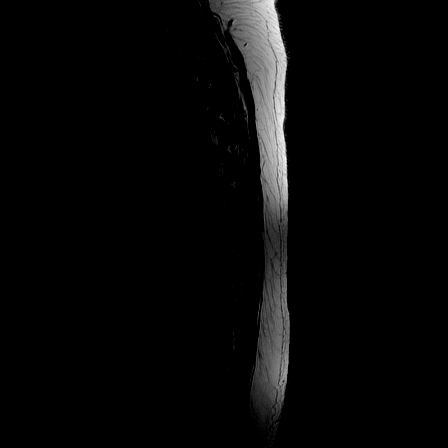
[im 10/15]
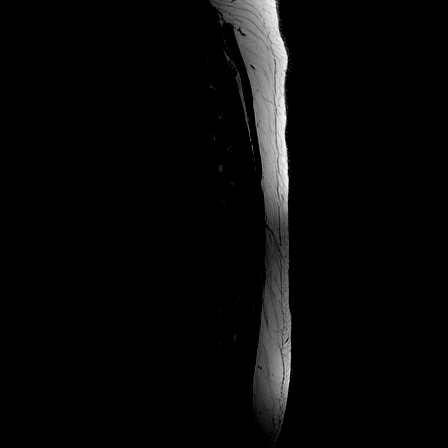
[im 15/15]
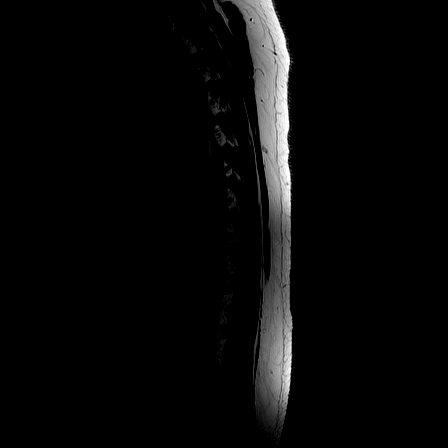

[Series 9: T2 · axial · 5.0mm · 0.62mm/px · z∈[-52,+104]mm · 6 of 22 slices shown (3 of 4)]
[im 1/22]
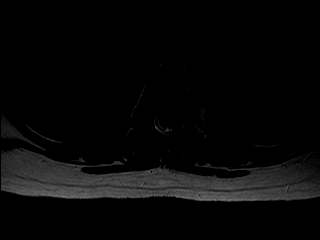
[im 5/22]
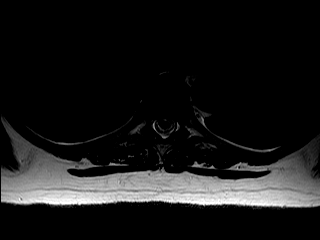
[im 9/22]
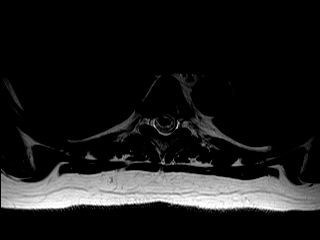
[im 13/22]
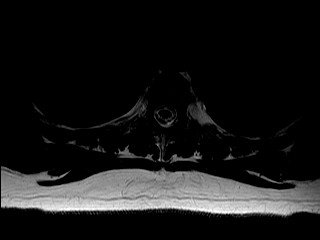
[im 17/22]
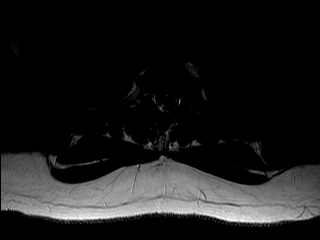
[im 22/22]
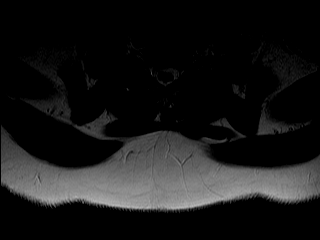

[Series 10: T2 · axial · 5.0mm · 0.62mm/px · z∈[-168,-26]mm · 5 of 20 slices shown (4 of 4)]
[im 1/20]
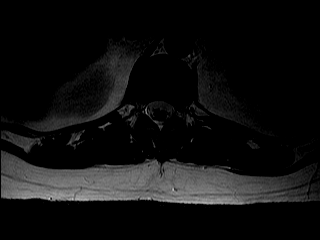
[im 5/20]
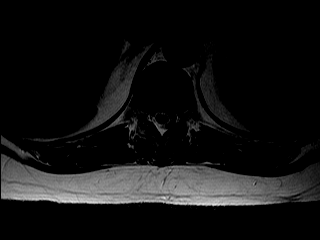
[im 10/20]
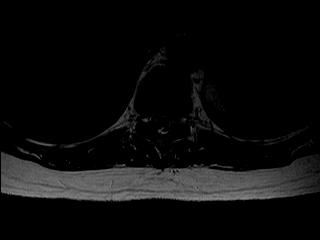
[im 15/20]
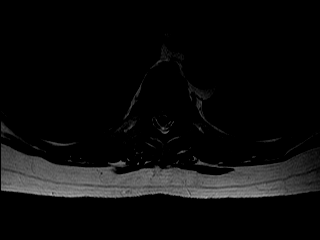
[im 20/20]
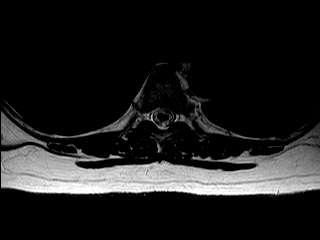

[Series 11: T1 · axial · 5.0mm · 0.78mm/px · z∈[-55,+101]mm · 6 of 22 slices shown (3 of 4)]
[im 1/22]
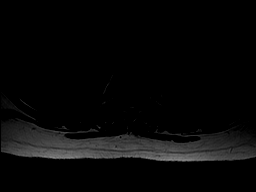
[im 5/22]
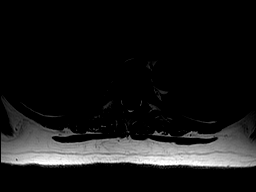
[im 9/22]
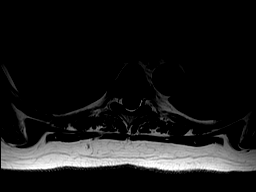
[im 13/22]
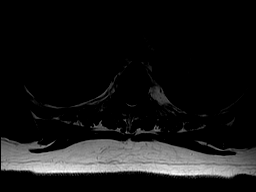
[im 17/22]
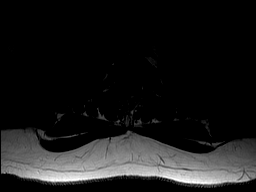
[im 22/22]
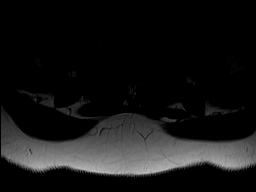

[Series 12: T1 · axial · 5.0mm · 0.78mm/px · z∈[-171,-112]mm · 3 of 22 slices shown (4 of 4)]
[im 1/22]
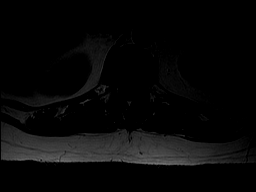
[im 5/22]
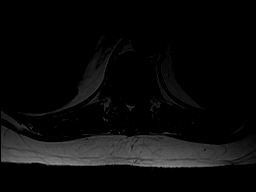
[im 9/22]
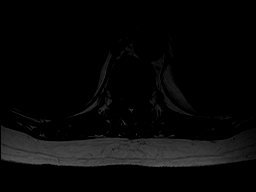

[36 of 48 positions shown; findings below may reference images not displayed]

DIAGNOSTIC STUDIES

EXAM

Thoracic spine MRI.

INDICATION

pain
mid back pain with radiation down x 1 month, rg

TECHNIQUE

Noncontrast MRI of the thoracic spine.

COMPARISONS

No prior studies are available for comparison.

FINDINGS

The vertebral body height and alignment is maintained. There is no fracture. No bone edema. The
thoracic cord signal and morphology is preserved without syrinx, myelomalacia, or edema.

There is no evidence of spinal stenosis at any level. No substantial loss of disc height. There is
a central/right paracentral disc protrusion at T7-T8 which effaces the anterior thecal sac but does
not result in significant stenosis. There is a right paracentral/foraminal disc protrusion at T8-T9
which results in mild to moderate narrowing of the right lateral recess. There is no neural
foraminal stenosis at any level. The included paraspinal soft tissues are unremarkable. The included
upper abdominal structures are within normal limits.

IMPRESSION

No acute osseous abnormality. Small posterior disc bulges at T7-T8 and T8-T9 with right lateral
recess narrowing at T8-T9. No neural foraminal stenosis.

Tech Notes:

mid back pain with radiation down x 1 month, rg

## 2021-04-11 ENCOUNTER — Encounter: Admit: 2021-04-11 | Discharge: 2021-04-11 | Payer: Commercial Managed Care - PPO

## 2021-04-11 NOTE — Patient Instructions
It was nice to see you today.  Thank you for choosing to visit our clinic.  Your time is important, and if you had to wait today, we do apologize.  Our goal is to run exactly on time.  However, on occasion, we get behind in clinic due to unexpected patient issues.  Thank you for your patience.    General Instructions:  Scheduling:  Our scheduling phone number is [phone].  Appointment Reminders on your cell phone:  Communication preferences can be managed in MyChart to ensure you receive important appointment notifications  How to reach our office:  Please send a MyChart message to the [facility] (directed to Dr. Cordell) or leave a voicemail for the nurse, Lorenza Winkleman, at [phone].  Support for many chronic illnesses is available through [facility] at [email or web address] or [phone].    For help with MyChart:  please call [phone].    For more information on spinal conditions:  please visit [email or web address]     Again, thank you for coming in today.

## 2021-04-11 NOTE — Telephone Encounter
Pre Visit Planning - New Patient    Reason for Visit: neck, bilateral and upper back pain    Referred by: Dr. Lona Kettle, MD  PCP:  same    Imaging: Tspine MRI at [facility] 02/16/21; Cspine MRI at [facility] 01/18/21.      Scoliosis: no    Previous spine surgery: no    Pain Management: can't remember     EMG/NCS: yes, Dr. Dirk Dress, DO at [facility]-requested.    Records Requested: Yes    Orders have been pended    New patient packet; patient made aware that this must be done PRIOR to visit or they will have to reschedule.  RN advised patient to arrive at least one hour prior to appointment to complete ppw and xrays.  Patient demonstrates understanding and is agreeable to plan.

## 2021-04-13 ENCOUNTER — Encounter: Admit: 2021-04-13 | Discharge: 2021-04-13 | Payer: Commercial Managed Care - PPO

## 2021-04-13 ENCOUNTER — Ambulatory Visit: Admit: 2021-04-13 | Discharge: 2021-04-13 | Payer: Commercial Managed Care - PPO

## 2021-04-13 DIAGNOSIS — F32A Depression: Secondary | ICD-10-CM

## 2021-04-13 DIAGNOSIS — I219 Acute myocardial infarction, unspecified: Secondary | ICD-10-CM

## 2021-04-13 DIAGNOSIS — H9190 Unspecified hearing loss, unspecified ear: Secondary | ICD-10-CM

## 2021-04-13 DIAGNOSIS — R519 Generalized headaches: Secondary | ICD-10-CM

## 2021-04-13 DIAGNOSIS — M542 Cervicalgia: Secondary | ICD-10-CM

## 2021-04-13 DIAGNOSIS — M2550 Pain in unspecified joint: Secondary | ICD-10-CM

## 2021-04-13 DIAGNOSIS — F419 Anxiety disorder, unspecified: Secondary | ICD-10-CM

## 2021-04-13 DIAGNOSIS — R011 Cardiac murmur, unspecified: Secondary | ICD-10-CM

## 2021-04-13 DIAGNOSIS — M549 Dorsalgia, unspecified: Secondary | ICD-10-CM

## 2021-04-13 DIAGNOSIS — G479 Sleep disorder, unspecified: Secondary | ICD-10-CM

## 2021-04-13 DIAGNOSIS — Z87898 Personal history of other specified conditions: Secondary | ICD-10-CM

## 2021-04-13 DIAGNOSIS — R6889 Other general symptoms and signs: Secondary | ICD-10-CM

## 2021-04-13 NOTE — Progress Notes
[facility] HISTORY AND PHYSICAL    Chief Complaint   Patient presents with    New Patient     Neck pain                   Vitals:    04/13/21 1359   BP: (!) 147/65   Pulse: 65   Temp: 36.5 C (97.7 F)   Resp: 20   SpO2: 99%   TempSrc: Skin   PainSc: Ten   Weight: 68.9 kg (152 lb)   Height: 159.4 cm (5' 2.75")        Medical History:   Diagnosis Date    Anxiety     Back pain     Cold intolerance     Depression     Generalized headaches     H/O shortness of breath     Hearing deficit     Heart attack (HCC)     Heart murmur     Heat intolerance     Joint pain     Neck pain     Sleep disorder          Surgical History:   Procedure Laterality Date    CARDIAC SURGERY      2011         Allergies   Allergen Reactions    Lisinopril RASH     Rash and fever      Codeine NAUSEA ONLY     Nauseated and elevated BP         No current outpatient medications on file prior to visit.     No current facility-administered medications on file prior to visit.         family history is not on file.      Social History     Socioeconomic History    Marital status: Married   Tobacco Use    Smoking status: Never    Smokeless tobacco: Never   Substance and Sexual Activity    Alcohol use: Not Currently    Drug use: Not Currently         Review of Systems       HPI / PHYSICAL EXAM / RADIOGRAPHIC EVALUATION /  IMPRESSION / PLAN      Dictation on: 04/13/2021  4:32 PM by: Wyona Almas [[email or web address]]                Total time 65 minutes.

## 2021-07-05 ENCOUNTER — Encounter: Admit: 2021-07-05 | Discharge: 2021-07-05 | Payer: Commercial Managed Care - PPO

## 2021-08-05 ENCOUNTER — Encounter: Admit: 2021-08-05 | Discharge: 2021-08-05 | Payer: Commercial Managed Care - PPO

## 2021-08-10 ENCOUNTER — Encounter: Admit: 2021-08-10 | Discharge: 2021-08-10 | Payer: Commercial Managed Care - PPO

## 2021-08-10 NOTE — Telephone Encounter
Patient requesting work letter dated 04/13/2021 be faxed to state of [place] at [phone].  RN faxed, as requested; transmission successful.  RN called patient to update that fax was successful.  RN also emailed to patient, as requested.  Patient demonstrates understanding and is agreeable to plan.

## 2022-04-24 ENCOUNTER — Encounter: Admit: 2022-04-24 | Discharge: 2022-04-24 | Payer: Commercial Managed Care - PPO

## 2023-12-24 ENCOUNTER — Encounter: Admit: 2023-12-24 | Discharge: 2023-12-24 | Payer: BLUE CROSS/BLUE SHIELD

## 2024-01-07 ENCOUNTER — Ambulatory Visit: Admit: 2024-01-07 | Discharge: 2024-01-07 | Payer: BLUE CROSS/BLUE SHIELD

## 2024-01-07 ENCOUNTER — Encounter: Admit: 2024-01-07 | Discharge: 2024-01-07 | Payer: BLUE CROSS/BLUE SHIELD

## 2024-01-07 VITALS — Ht 63.0 in | Wt 152.0 lb

## 2024-01-07 DIAGNOSIS — M25511 Pain in right shoulder: Principal | ICD-10-CM

## 2024-01-07 DIAGNOSIS — M25521 Pain in right elbow: Principal | ICD-10-CM

## 2024-01-07 NOTE — Patient Instructions [37]
 It was a pleasure seeing you in clinic today. Please contact our office if you have any questions or concerns.    Thank You,  Cala Bradford RN-BSN  Clinical Nurse for Dr. Hart Robinsons and Cleda Mccreedy PA-C  [facility]  [address]  [place], [place] [address]  P: [phone]  F: [phone]

## 2024-01-07 NOTE — Progress Notes [1]
 Date of Service: 01/07/2024           Monica Robinson is a 58 year old female who presents with right elbow pain and limited movement.    She describes significant issues with her right elbow, noting it feels 'stuck' and unable to extend fully. This condition has persisted for an extended period, initially noted while working at [employer]. 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 Pain is primarily experienced with terminal extension, accompanied by a sensation of pressure.    In addition to her elbow issues, she has bilateral carpal tunnel syndrome, with more severe symptoms in her left hand. She also experiences soreness in her bicep and pain in her wrist during certain movements.    Her right shoulder has been problematic for about a month, with significant pain. Popping sounds are experienced in the shoulder. A previous shoulder injection did not alleviate her symptoms.    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 Her previous orthopedic consultation did not result in any new diagnostic imaging or treatment.              Objective:          aspirin EC (ASPIR-LOW) 81 mg tablet Take one tablet by mouth daily.    carvediloL (COREG) 6.25 mg tablet Take one tablet by mouth twice daily with meals.    glipiZIDE ER (GLUCOTROL XL) 10 mg ER tablet Take one tablet by mouth daily.    hydrALAZINE (APRESOLINE) 25 mg tablet Take one tablet by mouth three times daily.    insulin lispro (U-100) (HUMALOG KWIKPEN INSULIN) 100 unit/mL subcutaneous PEN Inject seven Units under the skin three times daily with meals.    isosorbide mononitrate ER (IMDUR) 30 mg tablet,extended release 24 hr Take one tablet by mouth daily.    lidocaine (LIDODERM) 5 % topical patch 1 PATCH TOPICALLY DAILY LEAVE ON MOST PAINFUL AREA FOR UP TO 12 HRS    metFORMIN (GLUCOPHAGE) 500 mg tablet Take one tablet by mouth twice daily.    NIFEdipine SR (ADALAT CC) 60 mg tablet Take one tablet by mouth daily.    nitroglycerin (NITROSTAT) 0.4 mg tablet Place one tablet under tongue every 5 minutes as needed.    Omega-3 Fatty Acids 1,000 mg capsule Take two capsules by mouth daily.    rosuvastatin (CRESTOR) 40 mg tablet TAKE 1 TABLET BY MOUTH EVERY DAY AT NIGHT AT BEDTIME     Vitals:    01/07/24 1358   PainSc: Ten   Weight: 68.9 kg (152 lb)   Height: 160 cm (5' 3)     Body mass index is 26.93 kg/m?.         MUSCULOSKELETAL: Right elbow flexion 20-135 degrees, forearm rotation well maintained, pain with terminal extension. Good strength with resisted extension and flexion. Right shoulder active elevation to 140 degrees with pain, external rotation to 45 degrees, internal rotation to L5. Good strength with resisted external rotation, weakness with resisted elevation. Composite finger flexion of the palm, negative Tinel's in carpal tunnel.           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 X-ray shows small enthesophyte at the lateral epicondyle consistent with tennis elbow and similar enthesophyte at the tip of the olecranon. No significant elbow arthritis noted. Imaging supports lateral epicondylitis.  - Order x-ray of the right elbow.  - Recommend use of a strap around the forearm for support.  - Provide FMLA form for work leave.  - Advise obtaining the strap from a sporting goods store or online.    Right shoulder pain  Right shoulder pain with active elevation to 140 degrees and significant pain. External rotation to 45 degrees and internal rotation to L5, both painful. Previous shoulder injection did not provide relief. No significant bony abnormalities on x-ray. Physical therapy is considered for management to improve function and reduce pain.  - Prescribe physical therapy for the right shoulder.  - Advise against another shoulder injection at this time due to recent prior injection.
